# Patient Record
Sex: Female | Born: 1970 | Race: White | Hispanic: No | Marital: Married | Smoking: Current every day smoker
Health system: Southern US, Community
[De-identification: ages and names within clinical notes are randomized; demographics above are authoritative.]

## PROBLEM LIST (undated history)

## (undated) DIAGNOSIS — Z800 Family history of malignant neoplasm of digestive organs: Secondary | ICD-10-CM

## (undated) DIAGNOSIS — T8859XA Other complications of anesthesia, initial encounter: Secondary | ICD-10-CM

## (undated) DIAGNOSIS — Z803 Family history of malignant neoplasm of breast: Secondary | ICD-10-CM

## (undated) DIAGNOSIS — M1990 Unspecified osteoarthritis, unspecified site: Secondary | ICD-10-CM

## (undated) DIAGNOSIS — T7840XA Allergy, unspecified, initial encounter: Secondary | ICD-10-CM

## (undated) HISTORY — PX: DILATION AND CURETTAGE OF UTERUS: SHX78

## (undated) HISTORY — DX: Unspecified osteoarthritis, unspecified site: M19.90

## (undated) HISTORY — DX: Family history of malignant neoplasm of digestive organs: Z80.0

## (undated) HISTORY — DX: Family history of malignant neoplasm of breast: Z80.3

## (undated) HISTORY — PX: WISDOM TOOTH EXTRACTION: SHX21

## (undated) HISTORY — DX: Allergy, unspecified, initial encounter: T78.40XA

---

## 1997-11-13 ENCOUNTER — Other Ambulatory Visit: Admission: RE | Admit: 1997-11-13 | Discharge: 1997-11-13 | Payer: Self-pay | Admitting: Obstetrics and Gynecology

## 1999-02-15 ENCOUNTER — Other Ambulatory Visit: Admission: RE | Admit: 1999-02-15 | Discharge: 1999-02-15 | Payer: Self-pay | Admitting: Obstetrics and Gynecology

## 1999-02-21 ENCOUNTER — Encounter: Payer: Self-pay | Admitting: Obstetrics and Gynecology

## 1999-02-21 ENCOUNTER — Ambulatory Visit (HOSPITAL_COMMUNITY): Admission: RE | Admit: 1999-02-21 | Discharge: 1999-02-21 | Payer: Self-pay | Admitting: Obstetrics and Gynecology

## 1999-03-24 ENCOUNTER — Other Ambulatory Visit: Admission: RE | Admit: 1999-03-24 | Discharge: 1999-03-24 | Payer: Self-pay | Admitting: Obstetrics and Gynecology

## 1999-03-24 ENCOUNTER — Encounter (INDEPENDENT_AMBULATORY_CARE_PROVIDER_SITE_OTHER): Payer: Self-pay | Admitting: Specialist

## 1999-09-30 ENCOUNTER — Other Ambulatory Visit: Admission: RE | Admit: 1999-09-30 | Discharge: 1999-09-30 | Payer: Self-pay | Admitting: Obstetrics and Gynecology

## 2000-03-29 ENCOUNTER — Encounter (INDEPENDENT_AMBULATORY_CARE_PROVIDER_SITE_OTHER): Payer: Self-pay

## 2000-03-29 ENCOUNTER — Other Ambulatory Visit: Admission: RE | Admit: 2000-03-29 | Discharge: 2000-03-29 | Payer: Self-pay | Admitting: Obstetrics and Gynecology

## 2000-08-08 ENCOUNTER — Other Ambulatory Visit: Admission: RE | Admit: 2000-08-08 | Discharge: 2000-08-08 | Payer: Self-pay | Admitting: Obstetrics and Gynecology

## 2001-09-26 ENCOUNTER — Encounter: Admission: RE | Admit: 2001-09-26 | Discharge: 2001-09-26 | Payer: Self-pay | Admitting: Obstetrics and Gynecology

## 2001-09-26 ENCOUNTER — Encounter: Payer: Self-pay | Admitting: Obstetrics and Gynecology

## 2001-11-04 ENCOUNTER — Other Ambulatory Visit: Admission: RE | Admit: 2001-11-04 | Discharge: 2001-11-04 | Payer: Self-pay | Admitting: Obstetrics and Gynecology

## 2002-05-23 ENCOUNTER — Other Ambulatory Visit: Admission: RE | Admit: 2002-05-23 | Discharge: 2002-05-23 | Payer: Self-pay | Admitting: Obstetrics and Gynecology

## 2002-11-06 ENCOUNTER — Other Ambulatory Visit: Admission: RE | Admit: 2002-11-06 | Discharge: 2002-11-06 | Payer: Self-pay | Admitting: Obstetrics and Gynecology

## 2003-01-27 ENCOUNTER — Encounter: Admission: RE | Admit: 2003-01-27 | Discharge: 2003-01-27 | Payer: Self-pay | Admitting: Surgery

## 2004-02-09 ENCOUNTER — Other Ambulatory Visit: Admission: RE | Admit: 2004-02-09 | Discharge: 2004-02-09 | Payer: Self-pay | Admitting: Obstetrics and Gynecology

## 2004-02-29 ENCOUNTER — Encounter: Admission: RE | Admit: 2004-02-29 | Discharge: 2004-02-29 | Payer: Self-pay | Admitting: Obstetrics and Gynecology

## 2005-06-28 ENCOUNTER — Encounter: Admission: RE | Admit: 2005-06-28 | Discharge: 2005-06-28 | Payer: Self-pay | Admitting: Obstetrics and Gynecology

## 2005-07-20 ENCOUNTER — Encounter: Admission: RE | Admit: 2005-07-20 | Discharge: 2005-07-20 | Payer: Self-pay | Admitting: Obstetrics and Gynecology

## 2007-07-12 ENCOUNTER — Ambulatory Visit (HOSPITAL_COMMUNITY): Admission: RE | Admit: 2007-07-12 | Discharge: 2007-07-12 | Payer: Self-pay | Admitting: Obstetrics and Gynecology

## 2007-12-03 ENCOUNTER — Inpatient Hospital Stay (HOSPITAL_COMMUNITY): Admission: AD | Admit: 2007-12-03 | Discharge: 2007-12-07 | Payer: Self-pay | Admitting: Obstetrics and Gynecology

## 2010-06-24 ENCOUNTER — Encounter: Payer: Self-pay | Admitting: Gastroenterology

## 2010-08-04 ENCOUNTER — Ambulatory Visit: Payer: Self-pay | Admitting: Gastroenterology

## 2010-08-26 ENCOUNTER — Ambulatory Visit: Payer: Self-pay | Admitting: Gastroenterology

## 2010-09-20 ENCOUNTER — Encounter: Payer: Self-pay | Admitting: Gastroenterology

## 2010-09-20 ENCOUNTER — Ambulatory Visit (INDEPENDENT_AMBULATORY_CARE_PROVIDER_SITE_OTHER): Payer: BC Managed Care – PPO | Admitting: Gastroenterology

## 2010-09-20 VITALS — BP 100/62 | HR 72 | Ht 65.5 in | Wt 147.2 lb

## 2010-09-20 DIAGNOSIS — R0989 Other specified symptoms and signs involving the circulatory and respiratory systems: Secondary | ICD-10-CM

## 2010-09-20 DIAGNOSIS — F458 Other somatoform disorders: Secondary | ICD-10-CM

## 2010-09-20 DIAGNOSIS — K219 Gastro-esophageal reflux disease without esophagitis: Secondary | ICD-10-CM

## 2010-09-20 MED ORDER — DEXLANSOPRAZOLE 60 MG PO CPDR: 60.0000 mg | DELAYED_RELEASE_CAPSULE | Freq: Every day | ORAL | Status: AC

## 2010-09-20 NOTE — Patient Instructions (Signed)
Your prescription(s) have been sent to you pharmacy.  Today you have watched the [employer].     Acid Reflux (GERD) Acid reflux is also called gastroesophageal reflux disease (GERD). Your stomach makes acid to help digest food. Acid reflux happens when acid from your stomach goes into the tube between your mouth and stomach (esophagus). Your stomach is protected from the acid, but this tube is not. When acid gets into the tube, it may cause a burning feeling in the chest (heartburn). Besides heartburn, other health problems can happen if the acid keeps going into the tube. Some causes of acid reflux include:  Being overweight.   Smoking.   Drinking alcohol.   Eating large meals.   Eating meals and then going to bed right away.   Eating certain foods.   Increased stomach acid production.  HOME CARE  Take all medicine as told by your doctor.   You may need to:   Lose weight.   Avoid alcohol.   Quit smoking.   Do not eat big meals. It is better to eat smaller meals throughout the day.   Do not eat a meal and then nap or go to bed.   Sleep with your head higher than your stomach.   Avoid foods that bother you.   You may need more tests, or you may need to see a special doctor.  GET HELP RIGHT AWAY IF:  You have chest pain that is different than before.   You have pain that goes to your arms, jaw, or between your shoulder blades.   You throw up (vomit) blood, dark brown liquid, or your throw up looks like coffee grounds.   You have trouble swallowing.   You have trouble breathing or cannot stop coughing.   You feel dizzy or pass out.   Your skin is cool, wet, and pale.   Your medicine is not helping.  MAKE SURE YOU:   Understand these instructions.   Will watch your condition.   Will get help right away if you are not doing well or get worse.  Document Released: 06/21/2007 Document Re-Released: 03/29/2009 [facility] Patient Information 2011 [place],  [place].

## 2010-09-20 NOTE — Progress Notes (Signed)
History of Present Illness:  This is a nice 40 year old Caucasian female with one year of a progressive globus sensation in her throat without true reflux symptoms. Her only other complaint is one of postprandial bloatedness and on one of weight gain. She follows a regular diet but does not exercise regularly. She denies burning substernal chest pain, regurgitation, dysphagia, or any hepatobiliary lower gastrointestinal complaints. Her family history is remarkable for some type of esophageal difficulty and one of her grandmothers. Patient did not have previous radiographs or endoscopy procedures. He feels the summer for globus sensation may be related to stress, but denies anxiety or depression. She greatly had thyroid function test performed which were normal. She has no true dysphagia or systemic complaints. She is not on any medications but has an IUD present.  I have reviewed this patient's present history, medical and surgical past history, allergies and medications.     ROS: The remainder of the 10 point ROS is negative... no symptoms of Raynaud's phenomenon or collagen vascular disease. No history of hepatitis, pancreatitis, or other known gastrointestinal problems.     Physical Exam: General well developed well nourished patient in no acute distress, appearing herr stated age Eyes PERRLA, no icterus, fundoscopic exam per opthamologist Skin no lesions noted Neck supple, no adenopathy, no thyroid enlargement, no tenderness. Patient's oropharyngeal area was unremarkable. There is a small amount of tonsillar tissue in the left hypopharynx. Chest clear to percussion and auscultation Heart no significant murmurs, gallops or rubs noted Abdomen no hepatosplenomegaly masses or tenderness, BS normal.  Extremities no acute joint lesions, edema, phlebitis or evidence of cellulitis. Neurologic patient oriented x 3, cranial nerves intact, no focal neurologic deficits noted. Psychological mental status  normal and normal affect.  Assessment and plan: Probable globus sensation related to occult acid reflux. I have reviewed the extra esophageal manifestations of GERD with this patient in detail. We have elected to follow a strict anti-reflex regime, Dexilant 60 mg every morning, and office followup in 6-8 weeks' time. I have urged her to continue her dietary restraints but to begin a graduated aerobic exercise program. Will copy Dr. Stark Bray and Dr. Harold Hedge. Patient may need endoscopic exam, manometer, 24-hour pH probe testing depending on her clinical course and response  Encounter Diagnoses  Name Primary?  . Globus sensation Yes  . GERD (gastroesophageal reflux disease)

## 2010-10-19 LAB — CBC
HCT: 37.3
Hemoglobin: 12.9
MCHC: 35.3
MCV: 97
Platelets: 252
Platelets: 303
RDW: 13.1
RDW: 13.2
WBC: 10.6 — ABNORMAL HIGH

## 2010-10-19 LAB — COMPREHENSIVE METABOLIC PANEL
ALT: 16
AST: 20
Albumin: 2.3 — ABNORMAL LOW
BUN: 8
CO2: 23
Creatinine, Ser: 0.5
GFR calc Af Amer: >60
Glucose, Bld: 82
Potassium: 3.5
Sodium: 135

## 2010-10-19 LAB — URIC ACID: Uric Acid, Serum: 5.3

## 2011-12-28 ENCOUNTER — Other Ambulatory Visit: Payer: Self-pay | Admitting: Obstetrics and Gynecology

## 2011-12-28 DIAGNOSIS — R928 Other abnormal and inconclusive findings on diagnostic imaging of breast: Secondary | ICD-10-CM

## 2012-01-08 ENCOUNTER — Ambulatory Visit
Admission: RE | Admit: 2012-01-08 | Discharge: 2012-01-08 | Disposition: A | Discharge status: Home / Self Care | Payer: Private Health Insurance - Indemnity | Source: Ambulatory Visit | Attending: Obstetrics and Gynecology | Admitting: Obstetrics and Gynecology

## 2012-01-08 DIAGNOSIS — R928 Other abnormal and inconclusive findings on diagnostic imaging of breast: Secondary | ICD-10-CM

## 2012-10-25 ENCOUNTER — Ambulatory Visit (INDEPENDENT_AMBULATORY_CARE_PROVIDER_SITE_OTHER): Payer: BC Managed Care – PPO | Admitting: Internal Medicine

## 2012-10-25 ENCOUNTER — Ambulatory Visit: Payer: BC Managed Care – PPO

## 2012-10-25 VITALS — BP 120/80 | HR 63 | Temp 98.1°F | Resp 16 | Ht 67.0 in | Wt 139.0 lb

## 2012-10-25 DIAGNOSIS — R05 Cough: Secondary | ICD-10-CM

## 2012-10-25 DIAGNOSIS — R591 Generalized enlarged lymph nodes: Secondary | ICD-10-CM

## 2012-10-25 DIAGNOSIS — R5381 Other malaise: Secondary | ICD-10-CM

## 2012-10-25 DIAGNOSIS — R599 Enlarged lymph nodes, unspecified: Secondary | ICD-10-CM

## 2012-10-25 LAB — COMPREHENSIVE METABOLIC PANEL
ALT: 12 U/L (ref 0–35)
AST: 12 U/L (ref 0–37)
Albumin: 4.4 g/dL (ref 3.5–5.2)
Alkaline Phosphatase: 35 U/L — ABNORMAL LOW (ref 39–117)
BUN: 11 mg/dL (ref 6–23)
CO2: 26 mEq/L (ref 19–32)
Glucose, Bld: 87 mg/dL (ref 70–99)
Total Bilirubin: 0.4 mg/dL (ref 0.3–1.2)

## 2012-10-25 LAB — POCT CBC
Granulocyte percent: 57.9 %G (ref 37–80)
HCT, POC: 46 % (ref 37.7–47.9)
Hemoglobin: 14.8 g/dL (ref 12.2–16.2)
MCH, POC: 31.7 pg — AB (ref 27–31.2)
MCHC: 32.2 g/dL (ref 31.8–35.4)
MPV: 7.9 fL (ref 0–99.8)
POC Granulocyte: 3.2 (ref 2–6.9)
RDW, POC: 12.8 %

## 2012-10-25 NOTE — Progress Notes (Addendum)
Subjective:  This chart was scribed for Dr. Ellamae Sia, MD by Karle Plumber, ED Scribe. This patient was seen in room 3 and the patient's care was started at 11:48 AM.  Patient ID: Susan Daniel, female    DOB: 10-18-1970, 42 y.o.   MRN: [id number]  HPI HPI Comments:  Susan Daniel is a 42 y.o. female who presents to the [facility] complaining of feeling tired, aching and sore throat onset last week. She reports associated sneezing. She states she has been dealing with extreme stress. She reports associated sleep deprivation secondary to the stress. Pt reports the stress and insomnia has since relieved. She reports chronic seasonal allergies with associated chronic sinus infections that occur yearly. occas NP cough--occas R post thor pain. She reports extensive dental work that needs to be addressed but no current dental problems at the time. She denies weight loss or heart palpitations.  Pt reports having a Mirena IUD for birth control. She reports mild tenderness in the lymph nodes under her arms that have since resolved.Pt reports mild nausea earlier this week. She denies dysuria or frequency with urination. She denies waking up at night diaphoretic. She denies skin rashes. Pt reports being right handed. Pt also reports a bite on her abdomen that she has scratched the scab. She expressed concern about it not knowing where it came from or what may have caused it.    Review of Systems  Constitutional: Negative for fever, activity change, appetite change and unexpected weight change.  HENT: Positive for sore throat.   Respiratory: Negative for shortness of breath and wheezing.   Cardiovascular: Negative for palpitations and leg swelling.  Gastrointestinal: Positive for nausea and diarrhea. Negative for vomiting.       1 d diarr only-resolved  Genitourinary: Negative for dysuria and difficulty urinating.  Psychiatric/Behavioral: Negative for sleep disturbance.  All other systems reviewed  and are negative.      Objective:   Physical Exam  Nursing note and vitals reviewed. Constitutional: She is oriented to person, place, and time. She appears well-developed and well-nourished.  HENT:  Head: Normocephalic and atraumatic.  Right Ear: External ear normal.  Left Ear: External ear normal.  Neck: Neck supple. No thyromegaly present.  Tender along left anterior cervical area without masses. No axillary nodes  Cardiovascular: Normal rate and regular rhythm.   No murmur heard. Pulmonary/Chest: Effort normal and breath sounds normal. No respiratory distress.  Right lower lobe rales and rhonchi that do not clear with cough.  Abdominal: Soft. She exhibits no mass. There is no tenderness. There is no rebound and no guarding.  Musculoskeletal: She exhibits no edema.  Lymphadenopathy:    She has no cervical adenopathy.  Neurological: She is alert and oriented to person, place, and time.  Psychiatric: She has a normal mood and affect.    BP 120/80  Pulse 63  Temp(Src) 98.1 F (36.7 C) (Oral)  Resp 16  Ht 5\' 7"  (1.702 m)  Wt 139 lb (63.05 kg)  BMI 21.77 kg/m2  SpO2 99% [facility] reading (PRIMARY) by  Dr. Merla Riches no infiltrates  Results for orders placed in visit on 10/25/12  POCT CBC      Result Value Range   WBC 5.5  4.6 - 10.2 K/uL   Lymph, poc 1.9  0.6 - 3.4   POC LYMPH PERCENT 34.8  10 - 50 %L   MID (cbc) 0.4  0 - 0.9   POC MID % 7.3  0 - 12 %M  POC Granulocyte 3.2  2 - 6.9   Granulocyte percent 57.9  37 - 80 %G   RBC 4.67  4.04 - 5.48 M/uL   Hemoglobin 14.8  12.2 - 16.2 g/dL   HCT, POC 16.1  09.6 - 47.9 %   MCV 98.5 (*) 80 - 97 fL   MCH, POC 31.7 (*) 27 - 31.2 pg   MCHC 32.2  31.8 - 35.4 g/dL   RDW, POC 04.5     Platelet Count, POC 251  142 - 424 K/uL   MPV 7.9  0 - 99.8 fL       Assessment & Plan:    No orders of the defined types were placed in this encounter.    Problem List Items Addressed This Visit   None    Visit Diagnoses   Cough    -   Primary    Relevant Orders       DG Chest 2 View       POCT CBC (Completed)       TSH       Comprehensive metabolic panel    Other malaise and fatigue        Relevant Orders       POCT CBC (Completed)       TSH       Comprehensive metabolic panel    Adenopathy    Now resolved    Relevant Orders       POCT CBC (Completed)       TSH       Comprehensive metabolic panel       Pt verbalizes understanding and agrees to plan.

## 2012-10-26 LAB — TSH: TSH: 1.391 u[IU]/mL (ref 0.350–4.500)

## 2012-12-31 ENCOUNTER — Other Ambulatory Visit: Payer: Self-pay | Admitting: Obstetrics and Gynecology

## 2012-12-31 DIAGNOSIS — N6320 Unspecified lump in the left breast, unspecified quadrant: Secondary | ICD-10-CM

## 2013-01-14 ENCOUNTER — Ambulatory Visit
Admission: RE | Admit: 2013-01-14 | Discharge: 2013-01-14 | Disposition: A | Discharge status: Home / Self Care | Payer: BC Managed Care – PPO | Source: Ambulatory Visit | Attending: Obstetrics and Gynecology | Admitting: Obstetrics and Gynecology

## 2013-01-14 DIAGNOSIS — N6320 Unspecified lump in the left breast, unspecified quadrant: Secondary | ICD-10-CM

## 2013-01-21 ENCOUNTER — Ambulatory Visit (INDEPENDENT_AMBULATORY_CARE_PROVIDER_SITE_OTHER): Payer: BC Managed Care – PPO | Admitting: Family Medicine

## 2013-01-21 VITALS — BP 114/80 | HR 115 | Temp 98.2°F | Resp 18 | Ht 66.0 in | Wt 145.0 lb

## 2013-01-21 DIAGNOSIS — R05 Cough: Secondary | ICD-10-CM

## 2013-01-21 DIAGNOSIS — R059 Cough, unspecified: Secondary | ICD-10-CM

## 2013-01-21 DIAGNOSIS — J0100 Acute maxillary sinusitis, unspecified: Secondary | ICD-10-CM

## 2013-01-21 DIAGNOSIS — J029 Acute pharyngitis, unspecified: Secondary | ICD-10-CM

## 2013-01-21 MED ORDER — AMOXICILLIN 875 MG PO TABS: 875.0000 mg | ORAL_TABLET | Freq: Two times a day (BID) | ORAL | Status: DC

## 2013-01-21 NOTE — Progress Notes (Signed)
Subjective:    Patient ID: Susan Daniel, female    DOB: February 27, 1970, 43 y.o.   MRN: [id number] This chart was scribed for Susan Ray, MD by Vernell Barrier, Medical Scribe. This patient's care was started at 7:34 PM.  HPI HPI Comments: Susan Daniel is a 43 y.o. female who presents to the [facility] complaining of sore throat, cough, sinus pressure, onset a few days ago had improved some, but then worsened recently.  Reports mild green mucous production. States she was feeling bad before Christmas; had HA, body aches, and coughing but did not seek treatment. State she took Mucinex for those sxs with relief. Pt had sick contact with Bronchitis. Pt is unsure if she has come in contact with anybody with strep. Pt denies fever.   There are no active problems to display for this patient.  Past Medical History  Diagnosis Date  . Allergy   . Arthritis    History reviewed. No pertinent past surgical history. Allergies  Allergen Reactions  . Codeine   . Hydrocodone   . Sulfa Antibiotics    Prior to Admission medications   Medication Sig Start Date End Date Taking? Authorizing Provider  levonorgestrel (MIRENA) 20 MCG/24HR IUD 1 each by Intrauterine route once.      Historical Provider, MD   History   Social History  . Marital Status: Married    Spouse Name: N/A    Number of Children: 1  . Years of Education: N/A   Occupational History  . Surveyor, minerals    Social History Main Topics  . Smoking status: Current Every Day Smoker  . Smokeless tobacco: Never Used  . Alcohol Use: Yes     Comment: ocassionally  . Drug Use: No  . Sexual Activity: Not on file   Other Topics Concern  . Not on file   Social History Narrative  . No narrative on file   Review of Systems  Constitutional: Negative for fever.  HENT: Positive for rhinorrhea, sinus pressure and sore throat.   Respiratory: Positive for cough.       Objective:   Physical Exam  Vitals  reviewed. Constitutional: She is oriented to person, place, and time. She appears well-developed and well-nourished. No distress.  HENT:  Head: Normocephalic and atraumatic.  Right Ear: Hearing, tympanic membrane, external ear and ear canal normal.  Left Ear: Hearing, tympanic membrane, external ear and ear canal normal.  Nose: Nose normal.  Mouth/Throat: Oropharynx is clear and moist. No oropharyngeal exudate or posterior oropharyngeal erythema.  Maxillary sinus tenderness bilaterally. White to yellow nasal discharge right greater than left.   Eyes: Conjunctivae and EOM are normal. Pupils are equal, round, and reactive to light.  Cardiovascular: Normal rate, regular rhythm, normal heart sounds and intact distal pulses.  Exam reveals no gallop and no friction rub.   No murmur heard. Pulmonary/Chest: Effort normal and breath sounds normal. No respiratory distress. She has no wheezes. She has no rhonchi. She has no rales.  Lymphadenopathy:    She has no cervical adenopathy.  Neurological: She is alert and oriented to person, place, and time.  Skin: Skin is warm and dry. No rash noted.  Psychiatric: She has a normal mood and affect. Her behavior is normal.    Filed Vitals:   01/21/13 1835  BP: 114/80  Pulse: 115  Temp: 98.2 F (36.8 C)  TempSrc: Oral  Resp: 18  Height: 5\' 6"  (1.676 m)  Weight: 145 lb (65.772  kg)  SpO2: 99%      Assessment & Plan:   Susan Daniel is a 43 y.o. female Sinusitis, acute maxillary - Plan: amoxicillin (AMOXIL) 875 MG tablet  Sore throat - Plan: amoxicillin (AMOXIL) 875 MG tablet  Cough - Plan: amoxicillin (AMOXIL) 875 MG tablet  Suspected initial flu or ILI, with initial improvement then secondary sickening, early sinusitis.  Deferred testing for strep as afebrile and will be treating with amoxicillin for sinuses. Sx care as below and rtc precautions discussed.    Meds ordered this encounter  Medications  . amoxicillin (AMOXIL) 875 MG tablet      Sig: Take 1 tablet (875 mg total) by mouth 2 (two) times daily.    Dispense:  20 tablet    Refill:  0   Patient Instructions  Saline nasal spray atleast 4 times per day, over the counter mucinex or mucinex DM, drink plenty of fluids.  Return to clinic if worsening. Start antibiotic as discussed, Return to the clinic or go to the nearest emergency room if any of your symptoms worsen or new symptoms occur. Sinusitis Sinusitis is redness, soreness, and swelling (inflammation) of the paranasal sinuses. Paranasal sinuses are air pockets within the bones of your face (beneath the eyes, the middle of the forehead, or above the eyes). In healthy paranasal sinuses, mucus is able to drain out, and air is able to circulate through them by way of your nose. However, when your paranasal sinuses are inflamed, mucus and air can become trapped. This can allow bacteria and other germs to grow and cause infection. Sinusitis can develop quickly and last only a short time (acute) or continue over a long period (chronic). Sinusitis that lasts for more than 12 weeks is considered chronic.  CAUSES  Causes of sinusitis include:  Allergies.  Structural abnormalities, such as displacement of the cartilage that separates your nostrils (deviated septum), which can decrease the air flow through your nose and sinuses and affect sinus drainage.  Functional abnormalities, such as when the small hairs (cilia) that line your sinuses and help remove mucus do not work properly or are not present. SYMPTOMS  Symptoms of acute and chronic sinusitis are the same. The primary symptoms are pain and pressure around the affected sinuses. Other symptoms include:  Upper toothache.  Earache.  Headache.  Bad breath.  Decreased sense of smell and taste.  A cough, which worsens when you are lying flat.  Fatigue.  Fever.  Thick drainage from your nose, which often is green and may contain pus (purulent).  Swelling and  warmth over the affected sinuses. DIAGNOSIS  Your caregiver will perform a physical exam. During the exam, your caregiver may:  Look in your nose for signs of abnormal growths in your nostrils (nasal polyps).  Tap over the affected sinus to check for signs of infection.  View the inside of your sinuses (endoscopy) with a special imaging device with a light attached (endoscope), which is inserted into your sinuses. If your caregiver suspects that you have chronic sinusitis, one or more of the following tests may be recommended:  Allergy tests.  Nasal culture A sample of mucus is taken from your nose and sent to a lab and screened for bacteria.  Nasal cytology A sample of mucus is taken from your nose and examined by your caregiver to determine if your sinusitis is related to an allergy. TREATMENT  Most cases of acute sinusitis are related to a viral infection and will resolve on  their own within 10 days. Sometimes medicines are prescribed to help relieve symptoms (pain medicine, decongestants, nasal steroid sprays, or saline sprays).  However, for sinusitis related to a bacterial infection, your caregiver will prescribe antibiotic medicines. These are medicines that will help kill the bacteria causing the infection.  Rarely, sinusitis is caused by a fungal infection. In theses cases, your caregiver will prescribe antifungal medicine. For some cases of chronic sinusitis, surgery is needed. Generally, these are cases in which sinusitis recurs more than 3 times per year, despite other treatments. HOME CARE INSTRUCTIONS   Drink plenty of water. Water helps thin the mucus so your sinuses can drain more easily.  Use a humidifier.  Inhale steam 3 to 4 times a day (for example, sit in the bathroom with the shower running).  Apply a warm, moist washcloth to your face 3 to 4 times a day, or as directed by your caregiver.  Use saline nasal sprays to help moisten and clean your sinuses.  Take  over-the-counter or prescription medicines for pain, discomfort, or fever only as directed by your caregiver. SEEK IMMEDIATE MEDICAL CARE IF:  You have increasing pain or severe headaches.  You have nausea, vomiting, or drowsiness.  You have swelling around your face.  You have vision problems.  You have a stiff neck.  You have difficulty breathing. MAKE SURE YOU:   Understand these instructions.  Will watch your condition.  Will get help right away if you are not doing well or get worse. Document Released: 01/02/2005 Document Revised: 03/27/2011 Document Reviewed: 01/17/2011 [facility] Patient Information 2014 [place], [place]. Sore Throat A sore throat is pain, burning, irritation, or scratchiness of the throat. There is often pain or tenderness when swallowing or talking. A sore throat may be accompanied by other symptoms, such as coughing, sneezing, fever, and swollen neck glands. A sore throat is often the first sign of another sickness, such as a cold, flu, strep throat, or mononucleosis (commonly known as mono). Most sore throats go away without medical treatment. CAUSES  The most common causes of a sore throat include:  A viral infection, such as a cold, flu, or mono.  A bacterial infection, such as strep throat, tonsillitis, or whooping cough.  Seasonal allergies.  Dryness in the air.  Irritants, such as smoke or pollution.  Gastroesophageal reflux disease (GERD). HOME CARE INSTRUCTIONS   Only take over-the-counter medicines as directed by your caregiver.  Drink enough fluids to keep your urine clear or pale yellow.  Rest as needed.  Try using throat sprays, lozenges, or sucking on hard candy to ease any pain (if older than 4 years or as directed).  Sip warm liquids, such as broth, herbal tea, or warm water with honey to relieve pain temporarily. You may also eat or drink cold or frozen liquids such as frozen ice pops.  Gargle with salt water (mix 1 tsp salt  with 8 oz of water).  Do not smoke and avoid secondhand smoke.  Put a cool-mist humidifier in your bedroom at night to moisten the air. You can also turn on a hot shower and sit in the bathroom with the door closed for 5 10 minutes. SEEK IMMEDIATE MEDICAL CARE IF:  You have difficulty breathing.  You are unable to swallow fluids, soft foods, or your saliva.  You have increased swelling in the throat.  Your sore throat does not get better in 7 days.  You have nausea and vomiting.  You have a fever or persistent  symptoms for more than 2 3 days.  You have a fever and your symptoms suddenly get worse. MAKE SURE YOU:   Understand these instructions.  Will watch your condition.  Will get help right away if you are not doing well or get worse. Document Released: 02/10/2004 Document Revised: 12/20/2011 Document Reviewed: 09/10/2011 [facility] Patient Information 2014 [place], [place].     I personally performed the services described in this documentation, which was scribed in my presence. The recorded information has been reviewed and considered, and addended by me as needed.

## 2013-01-21 NOTE — Patient Instructions (Addendum)
Saline nasal spray atleast 4 times per day, over the counter mucinex or mucinex DM, drink plenty of fluids.  Return to clinic if worsening. Start antibiotic as discussed, Return to the clinic or go to the nearest emergency room if any of your symptoms worsen or new symptoms occur. Sinusitis Sinusitis is redness, soreness, and swelling (inflammation) of the paranasal sinuses. Paranasal sinuses are air pockets within the bones of your face (beneath the eyes, the middle of the forehead, or above the eyes). In healthy paranasal sinuses, mucus is able to drain out, and air is able to circulate through them by way of your nose. However, when your paranasal sinuses are inflamed, mucus and air can become trapped. This can allow bacteria and other germs to grow and cause infection. Sinusitis can develop quickly and last only a short time (acute) or continue over a long period (chronic). Sinusitis that lasts for more than 12 weeks is considered chronic.  CAUSES  Causes of sinusitis include:  Allergies.  Structural abnormalities, such as displacement of the cartilage that separates your nostrils (deviated septum), which can decrease the air flow through your nose and sinuses and affect sinus drainage.  Functional abnormalities, such as when the small hairs (cilia) that line your sinuses and help remove mucus do not work properly or are not present. SYMPTOMS  Symptoms of acute and chronic sinusitis are the same. The primary symptoms are pain and pressure around the affected sinuses. Other symptoms include:  Upper toothache.  Earache.  Headache.  Bad breath.  Decreased sense of smell and taste.  A cough, which worsens when you are lying flat.  Fatigue.  Fever.  Thick drainage from your nose, which often is green and may contain pus (purulent).  Swelling and warmth over the affected sinuses. DIAGNOSIS  Your caregiver will perform a physical exam. During the exam, your caregiver may:  Look in  your nose for signs of abnormal growths in your nostrils (nasal polyps).  Tap over the affected sinus to check for signs of infection.  View the inside of your sinuses (endoscopy) with a special imaging device with a light attached (endoscope), which is inserted into your sinuses. If your caregiver suspects that you have chronic sinusitis, one or more of the following tests may be recommended:  Allergy tests.  Nasal culture A sample of mucus is taken from your nose and sent to a lab and screened for bacteria.  Nasal cytology A sample of mucus is taken from your nose and examined by your caregiver to determine if your sinusitis is related to an allergy. TREATMENT  Most cases of acute sinusitis are related to a viral infection and will resolve on their own within 10 days. Sometimes medicines are prescribed to help relieve symptoms (pain medicine, decongestants, nasal steroid sprays, or saline sprays).  However, for sinusitis related to a bacterial infection, your caregiver will prescribe antibiotic medicines. These are medicines that will help kill the bacteria causing the infection.  Rarely, sinusitis is caused by a fungal infection. In theses cases, your caregiver will prescribe antifungal medicine. For some cases of chronic sinusitis, surgery is needed. Generally, these are cases in which sinusitis recurs more than 3 times per year, despite other treatments. HOME CARE INSTRUCTIONS   Drink plenty of water. Water helps thin the mucus so your sinuses can drain more easily.  Use a humidifier.  Inhale steam 3 to 4 times a day (for example, sit in the bathroom with the shower running).  Apply a  warm, moist washcloth to your face 3 to 4 times a day, or as directed by your caregiver.  Use saline nasal sprays to help moisten and clean your sinuses.  Take over-the-counter or prescription medicines for pain, discomfort, or fever only as directed by your caregiver. SEEK IMMEDIATE MEDICAL CARE  IF:  You have increasing pain or severe headaches.  You have nausea, vomiting, or drowsiness.  You have swelling around your face.  You have vision problems.  You have a stiff neck.  You have difficulty breathing. MAKE SURE YOU:   Understand these instructions.  Will watch your condition.  Will get help right away if you are not doing well or get worse. Document Released: 01/02/2005 Document Revised: 03/27/2011 Document Reviewed: 01/17/2011 [employer] Patient Information 2014 [place], [place]. Sore Throat A sore throat is pain, burning, irritation, or scratchiness of the throat. There is often pain or tenderness when swallowing or talking. A sore throat may be accompanied by other symptoms, such as coughing, sneezing, fever, and swollen neck glands. A sore throat is often the first sign of another sickness, such as a cold, flu, strep throat, or mononucleosis (commonly known as mono). Most sore throats go away without medical treatment. CAUSES  The most common causes of a sore throat include:  A viral infection, such as a cold, flu, or mono.  A bacterial infection, such as strep throat, tonsillitis, or whooping cough.  Seasonal allergies.  Dryness in the air.  Irritants, such as smoke or pollution.  Gastroesophageal reflux disease (GERD). HOME CARE INSTRUCTIONS   Only take over-the-counter medicines as directed by your caregiver.  Drink enough fluids to keep your urine clear or pale yellow.  Rest as needed.  Try using throat sprays, lozenges, or sucking on hard candy to ease any pain (if older than 4 years or as directed).  Sip warm liquids, such as broth, herbal tea, or warm water with honey to relieve pain temporarily. You may also eat or drink cold or frozen liquids such as frozen ice pops.  Gargle with salt water (mix 1 tsp salt with 8 oz of water).  Do not smoke and avoid secondhand smoke.  Put a cool-mist humidifier in your bedroom at night to moisten the air.  You can also turn on a hot shower and sit in the bathroom with the door closed for 5 10 minutes. SEEK IMMEDIATE MEDICAL CARE IF:  You have difficulty breathing.  You are unable to swallow fluids, soft foods, or your saliva.  You have increased swelling in the throat.  Your sore throat does not get better in 7 days.  You have nausea and vomiting.  You have a fever or persistent symptoms for more than 2 3 days.  You have a fever and your symptoms suddenly get worse. MAKE SURE YOU:   Understand these instructions.  Will watch your condition.  Will get help right away if you are not doing well or get worse. Document Released: 02/10/2004 Document Revised: 12/20/2011 Document Reviewed: 09/10/2011 [facility] Patient Information 2014 [place], [place].

## 2013-10-16 ENCOUNTER — Ambulatory Visit (INDEPENDENT_AMBULATORY_CARE_PROVIDER_SITE_OTHER): Payer: BC Managed Care – PPO | Admitting: Emergency Medicine

## 2013-10-16 VITALS — BP 128/78 | HR 96 | Temp 98.0°F | Resp 18 | Ht 66.75 in | Wt 153.0 lb

## 2013-10-16 DIAGNOSIS — B9789 Other viral agents as the cause of diseases classified elsewhere: Principal | ICD-10-CM

## 2013-10-16 DIAGNOSIS — J069 Acute upper respiratory infection, unspecified: Secondary | ICD-10-CM

## 2013-10-16 MED ORDER — GUAIFENESIN-DM 100-10 MG/5ML PO SYRP: 5.0000 mL | ORAL_SOLUTION | ORAL | Status: DC | PRN

## 2013-10-16 MED ORDER — PSEUDOEPHEDRINE-GUAIFENESIN ER 60-600 MG PO TB12: 1.0000 | ORAL_TABLET | Freq: Two times a day (BID) | ORAL | Status: AC

## 2013-10-16 NOTE — Patient Instructions (Signed)
Upper Respiratory Infection, Adult An upper respiratory infection (URI) is also sometimes known as the common cold. The upper respiratory tract includes the nose, sinuses, throat, trachea, and bronchi. Bronchi are the airways leading to the lungs. Most people improve within 1 week, but symptoms can last up to 2 weeks. A residual cough may last even longer.  CAUSES Many different viruses can infect the tissues lining the upper respiratory tract. The tissues become irritated and inflamed and often become very moist. Mucus production is also common. A cold is contagious. You can easily spread the virus to others by oral contact. This includes kissing, sharing a glass, coughing, or sneezing. Touching your mouth or nose and then touching a surface, which is then touched by another person, can also spread the virus. SYMPTOMS  Symptoms typically develop 1 to 3 days after you come in contact with a cold virus. Symptoms vary from person to person. They may include:  Runny nose.  Sneezing.  Nasal congestion.  Sinus irritation.  Sore throat.  Loss of voice (laryngitis).  Cough.  Fatigue.  Muscle aches.  Loss of appetite.  Headache.  Low-grade fever. DIAGNOSIS  You might diagnose your own cold based on familiar symptoms, since most people get a cold 2 to 3 times a year. Your caregiver can confirm this based on your exam. Most importantly, your caregiver can check that your symptoms are not due to another disease such as strep throat, sinusitis, pneumonia, asthma, or epiglottitis. Blood tests, throat tests, and X-rays are not necessary to diagnose a common cold, but they may sometimes be helpful in excluding other more serious diseases. Your caregiver will decide if any further tests are required. RISKS AND COMPLICATIONS  You may be at risk for a more severe case of the common cold if you smoke cigarettes, have chronic heart disease (such as heart failure) or lung disease (such as asthma), or if  you have a weakened immune system. The very young and very old are also at risk for more serious infections. Bacterial sinusitis, middle ear infections, and bacterial pneumonia can complicate the common cold. The common cold can worsen asthma and chronic obstructive pulmonary disease (COPD). Sometimes, these complications can require emergency medical care and may be life-threatening. PREVENTION  The best way to protect against getting a cold is to practice good hygiene. Avoid oral or hand contact with people with cold symptoms. Wash your hands often if contact occurs. There is no clear evidence that vitamin C, vitamin E, echinacea, or exercise reduces the chance of developing a cold. However, it is always recommended to get plenty of rest and practice good nutrition. TREATMENT  Treatment is directed at relieving symptoms. There is no cure. Antibiotics are not effective, because the infection is caused by a virus, not by bacteria. Treatment may include:  Increased fluid intake. Sports drinks offer valuable electrolytes, sugars, and fluids.  Breathing heated mist or steam (vaporizer or shower).  Eating chicken soup or other clear broths, and maintaining good nutrition.  Getting plenty of rest.  Using gargles or lozenges for comfort.  Controlling fevers with ibuprofen or acetaminophen as directed by your caregiver.  Increasing usage of your inhaler if you have asthma. Zinc gel and zinc lozenges, taken in the first 24 hours of the common cold, can shorten the duration and lessen the severity of symptoms. Pain medicines may help with fever, muscle aches, and throat pain. A variety of non-prescription medicines are available to treat congestion and runny nose. Your caregiver   can make recommendations and may suggest nasal or lung inhalers for other symptoms.  HOME CARE INSTRUCTIONS   Only take over-the-counter or prescription medicines for pain, discomfort, or fever as directed by your  caregiver.  Use a warm mist humidifier or inhale steam from a shower to increase air moisture. This may keep secretions moist and make it easier to breathe.  Drink enough water and fluids to keep your urine clear or pale yellow.  Rest as needed.  Return to work when your temperature has returned to normal or as your caregiver advises. You may need to stay home longer to avoid infecting others. You can also use a face mask and careful hand washing to prevent spread of the virus. SEEK MEDICAL CARE IF:   After the first few days, you feel you are getting worse rather than better.  You need your caregiver's advice about medicines to control symptoms.  You develop chills, worsening shortness of breath, or brown or red sputum. These may be signs of pneumonia.  You develop yellow or brown nasal discharge or pain in the face, especially when you bend forward. These may be signs of sinusitis.  You develop a fever, swollen neck glands, pain with swallowing, or white areas in the back of your throat. These may be signs of strep throat. SEEK IMMEDIATE MEDICAL CARE IF:   You have a fever.  You develop severe or persistent headache, ear pain, sinus pain, or chest pain.  You develop wheezing, a prolonged cough, cough up blood, or have a change in your usual mucus (if you have chronic lung disease).  You develop sore muscles or a stiff neck. Document Released: 06/28/2000 Document Revised: 03/27/2011 Document Reviewed: 04/09/2013 [employer] Patient Information 2015 [employer]. This information is not intended to replace advice given to you by your health care provider. Make sure you discuss any questions you have with your health care provider.  

## 2013-10-16 NOTE — Progress Notes (Signed)
[facility] [address], [place] [address] [phone]  Date:  10/16/2013   Name:  Susan Daniel   DOB:  Jun 08, 1970   MRN:  [id number]  PCP:  Leandrew Koyanagi, MD    Chief Complaint: Headache and Neck Pain   History of Present Illness:  Susan Daniel is a 43 y.o. very pleasant female patient who presents with the following:  Had a "cold" in august and another when "cold" a few weeks ago.  Now has nasal congestion and post nasal drainage. Hoarse and sore throat. No dysphagia Cough that is not productive.  No wheezing or shortness of breath Has headache, malaise and myalgias. Pain in back of neck after coughing.   No fever or chills. No improvement with over the counter medications or other home remedies. Denies other complaint or health concern today.   There are no active problems to display for this patient.   Past Medical History  Diagnosis Date  . Allergy   . Arthritis     History reviewed. No pertinent past surgical history.  History  Substance Use Topics  . Smoking status: Current Every Day Smoker  . Smokeless tobacco: Never Used  . Alcohol Use: Yes     Comment: ocassionally    Family History  Problem Relation Age of Onset  . Heart disease Paternal Grandfather   . Aneurysm Paternal Grandfather   . Irritable bowel syndrome      Cousins  . Kidney disease Maternal Uncle   . Diabetes Maternal Grandmother   . Heart disease Maternal Grandmother   . Breast cancer Mother   . Kidney disease Maternal Grandfather     Allergies  Allergen Reactions  . Codeine   . Hydrocodone   . Sulfa Antibiotics     Medication list has been reviewed and updated.  Current Outpatient Prescriptions on File Prior to Visit  Medication Sig Dispense Refill  . levonorgestrel (MIRENA) 20 MCG/24HR IUD 1 each by Intrauterine route once.         No current facility-administered medications on file prior to visit.    Review of Systems:  As per  HPI, otherwise negative.    Physical Examination: Filed Vitals:   10/16/13 1524  BP: 128/78  Pulse: 96  Temp: 98 F (36.7 C)  Resp: 18   Filed Vitals:   10/16/13 1524  Height: 5' 6.75" (1.695 m)  Weight: 153 lb (69.4 kg)   Body mass index is 24.16 kg/(m^2). Ideal Body Weight: Weight in (lb) to have BMI = 25: 158.1  GEN: WDWN, NAD, Non-toxic, A & O x 3 HEENT: Atraumatic, Normocephalic. Neck supple. No masses, No LAD. Ears and Nose: No external deformity. CV: RRR, No M/G/R. No JVD. No thrill. No extra heart sounds. PULM: CTA B, no wheezes, crackles, rhonchi. No retractions. No resp. distress. No accessory muscle use. ABD: S, NT, ND, +BS. No rebound. No HSM. EXTR: No c/c/e NEURO Normal gait.  PSYCH: Normally interactive. Conversant. Not depressed or anxious appearing.  Calm demeanor.    Assessment and Plan: Viral URI Phen c cod mucinex d  Signed,  Ellison Carwin, MD

## 2014-01-02 ENCOUNTER — Other Ambulatory Visit: Payer: Self-pay | Admitting: Obstetrics and Gynecology

## 2014-01-02 DIAGNOSIS — Z803 Family history of malignant neoplasm of breast: Secondary | ICD-10-CM

## 2014-01-02 DIAGNOSIS — R922 Inconclusive mammogram: Secondary | ICD-10-CM

## 2014-01-16 ENCOUNTER — Ambulatory Visit (INDEPENDENT_AMBULATORY_CARE_PROVIDER_SITE_OTHER): Payer: BC Managed Care – PPO | Admitting: Emergency Medicine

## 2014-01-16 VITALS — BP 126/80 | HR 75 | Temp 98.1°F | Resp 18 | Ht 66.0 in | Wt 152.8 lb

## 2014-01-16 DIAGNOSIS — J020 Streptococcal pharyngitis: Secondary | ICD-10-CM

## 2014-01-16 MED ORDER — PENICILLIN V POTASSIUM 500 MG PO TABS: 500.0000 mg | ORAL_TABLET | Freq: Four times a day (QID) | ORAL | Status: DC

## 2014-01-16 NOTE — Patient Instructions (Signed)

## 2014-01-16 NOTE — Progress Notes (Signed)
[facility] [address] [place] [address] [phone]  Date:  01/16/2014   Name:  Susan Daniel   DOB:  Jul 27, 1970   MRN:  [id number]  PCP:  Leandrew Koyanagi, MD    Chief Complaint: Sore Throat   History of Present Illness:  Susan Daniel is a 44 y.o. very pleasant female patient who presents with the following:  Ill with a sore throat for past week No fever or chills No cough or coryza No nausea or vomiting No stool change Husband has similar symptoms No improvement with over the counter medications or other home remedies.  Denies other complaint or health concern today.   There are no active problems to display for this patient.   Past Medical History  Diagnosis Date  . Allergy   . Arthritis     History reviewed. No pertinent past surgical history.  History  Substance Use Topics  . Smoking status: Current Every Day Smoker  . Smokeless tobacco: Never Used  . Alcohol Use: Yes     Comment: ocassionally    Family History  Problem Relation Age of Onset  . Heart disease Paternal Grandfather   . Aneurysm Paternal Grandfather   . Irritable bowel syndrome      Cousins  . Kidney disease Maternal Uncle   . Diabetes Maternal Grandmother   . Heart disease Maternal Grandmother   . Breast cancer Mother   . Kidney disease Maternal Grandfather     Allergies  Allergen Reactions  . Codeine   . Hydrocodone   . Sulfa Antibiotics     Medication list has been reviewed and updated.  Current Outpatient Prescriptions on File Prior to Visit  Medication Sig Dispense Refill  . guaiFENesin-dextromethorphan (ROBITUSSIN DM) 100-10 MG/5ML syrup Take 5 mLs by mouth every 4 (four) hours as needed for cough. 118 mL 0  . levonorgestrel (MIRENA) 20 MCG/24HR IUD 1 each by Intrauterine route once.      . pseudoephedrine-guaifenesin (MUCINEX D) 60-600 MG per tablet Take 1 tablet by mouth every 12 (twelve) hours. 18 tablet 0   No current  facility-administered medications on file prior to visit.    Review of Systems:  As per HPI, otherwise negative.    Physical Examination: Filed Vitals:   01/16/14 1504  BP: 126/80  Pulse: 75  Temp: 98.1 F (36.7 C)  Resp: 18   Filed Vitals:   01/16/14 1504  Height: 5\' 6"  (1.676 m)  Weight: 152 lb 12.8 oz (69.31 kg)   Body mass index is 24.67 kg/(m^2). Ideal Body Weight: Weight in (lb) to have BMI = 25: 154.6  GEN: WDWN, NAD, Non-toxic, A & O x 3 HEENT: Atraumatic, Normocephalic. Neck supple. No masses, No LAD.  Erythematous oropharynx Ears and Nose: No external deformity. CV: RRR, No M/G/R. No JVD. No thrill. No extra heart sounds. PULM: CTA B, no wheezes, crackles, rhonchi. No retractions. No resp. distress. No accessory muscle use. ABD: S, NT, ND, +BS. No rebound. No HSM. EXTR: No c/c/e NEURO Normal gait.  PSYCH: Normally interactive. Conversant. Not depressed or anxious appearing.  Calm demeanor.    Assessment and Plan: Strep Pen vk  Signed,  Ellison Carwin, MD

## 2014-01-24 ENCOUNTER — Telehealth: Payer: Self-pay

## 2014-01-24 NOTE — Telephone Encounter (Signed)
Pt called stating she was here 01/16/14 and was treated for a sore throat and given penicillin v potassium (VEETID) 500 MG tablet [[id number]. Her throat is still very sore and she wants to know what to do. Please advise at [phone]

## 2014-01-25 NOTE — Telephone Encounter (Signed)
LMOM to RTC if not well.

## 2014-12-14 ENCOUNTER — Encounter: Payer: Self-pay | Admitting: Internal Medicine

## 2015-01-05 ENCOUNTER — Other Ambulatory Visit: Payer: Self-pay | Admitting: Obstetrics and Gynecology

## 2015-01-05 DIAGNOSIS — N6310 Unspecified lump in the right breast, unspecified quadrant: Secondary | ICD-10-CM

## 2015-01-08 ENCOUNTER — Other Ambulatory Visit: Payer: Self-pay

## 2015-01-13 ENCOUNTER — Ambulatory Visit
Admission: RE | Admit: 2015-01-13 | Discharge: 2015-01-13 | Disposition: A | Discharge status: Home / Self Care | Payer: 59 | Source: Ambulatory Visit | Attending: Obstetrics and Gynecology | Admitting: Obstetrics and Gynecology

## 2015-01-13 DIAGNOSIS — N6310 Unspecified lump in the right breast, unspecified quadrant: Secondary | ICD-10-CM

## 2016-11-28 ENCOUNTER — Encounter: Payer: Self-pay | Admitting: Emergency Medicine

## 2016-11-28 ENCOUNTER — Other Ambulatory Visit: Payer: Self-pay

## 2016-11-28 ENCOUNTER — Ambulatory Visit: Payer: 59 | Admitting: Emergency Medicine

## 2016-11-28 VITALS — BP 108/76 | HR 89 | Temp 98.8°F | Resp 16 | Ht 65.0 in | Wt 138.4 lb

## 2016-11-28 DIAGNOSIS — J0191 Acute recurrent sinusitis, unspecified: Secondary | ICD-10-CM

## 2016-11-28 DIAGNOSIS — J3489 Other specified disorders of nose and nasal sinuses: Secondary | ICD-10-CM

## 2016-11-28 DIAGNOSIS — R0981 Nasal congestion: Secondary | ICD-10-CM

## 2016-11-28 MED ORDER — TRIAMCINOLONE ACETONIDE 55 MCG/ACT NA AERO: 2.0000 | INHALATION_SPRAY | Freq: Every day | NASAL | 12 refills | Status: DC

## 2016-11-28 MED ORDER — AMOXICILLIN-POT CLAVULANATE 875-125 MG PO TABS: 1.0000 | ORAL_TABLET | Freq: Two times a day (BID) | ORAL | 0 refills | Status: AC

## 2016-11-28 MED ORDER — PREDNISONE 20 MG PO TABS: 40.0000 mg | ORAL_TABLET | Freq: Every day | ORAL | 0 refills | Status: AC

## 2016-11-28 NOTE — Patient Instructions (Addendum)
     IF you received an x-ray today, you will receive an invoice from [facility]. Please contact [facility] at [phone] with questions or concerns regarding your invoice.   IF you received labwork today, you will receive an invoice from [facility]. Please contact [facility] at [phone] with questions or concerns regarding your invoice.   Our billing staff will not be able to assist you with questions regarding bills from these companies.  You will be contacted with the lab results as soon as they are available. The fastest way to get your results is to activate your My Chart account. Instructions are located on the last page of this paperwork. If you have not heard from us regarding the results in 2 weeks, please contact this office.     Sinusitis, Adult Sinusitis is soreness and inflammation of your sinuses. Sinuses are hollow spaces in the bones around your face. They are located:  Around your eyes.  In the middle of your forehead.  Behind your nose.  In your cheekbones.  Your sinuses and nasal passages are lined with a stringy fluid (mucus). Mucus normally drains out of your sinuses. When your nasal tissues get inflamed or swollen, the mucus can get trapped or blocked so air cannot flow through your sinuses. This lets bacteria, viruses, and funguses grow, and that leads to infection. Follow these instructions at home: Medicines  Take, use, or apply over-the-counter and prescription medicines only as told by your doctor. These may include nasal sprays.  If you were prescribed an antibiotic medicine, take it as told by your doctor. Do not stop taking the antibiotic even if you start to feel better. Hydrate and Humidify  Drink enough water to keep your pee (urine) clear or pale yellow.  Use a cool mist humidifier to keep the humidity level in your home above 50%.  Breathe in steam for 10-15 minutes, 3-4 times a day or as told by your doctor. You can do  this in the bathroom while a hot shower is running.  Try not to spend time in cool or dry air. Rest  Rest as much as possible.  Sleep with your head raised (elevated).  Make sure to get enough sleep each night. General instructions  Put a warm, moist washcloth on your face 3-4 times a day or as told by your doctor. This will help with discomfort.  Wash your hands often with soap and water. If there is no soap and water, use hand sanitizer.  Do not smoke. Avoid being around people who are smoking (secondhand smoke).  Keep all follow-up visits as told by your doctor. This is important. Contact a doctor if:  You have a fever.  Your symptoms get worse.  Your symptoms do not get better within 10 days. Get help right away if:  You have a very bad headache.  You cannot stop throwing up (vomiting).  You have pain or swelling around your face or eyes.  You have trouble seeing.  You feel confused.  Your neck is stiff.  You have trouble breathing. This information is not intended to replace advice given to you by your health care provider. Make sure you discuss any questions you have with your health care provider. Document Released: 06/21/2007 Document Revised: 08/29/2015 Document Reviewed: 10/28/2014 [employer] Interactive Patient Education  2018 [employer]  

## 2016-11-28 NOTE — Progress Notes (Signed)
Susan Daniel 46 y.o.   Chief Complaint  Patient presents with  . Sinus Problem    with pressure X 2 weeks it started  . Ear Pain    LEFT    HISTORY OF PRESENT ILLNESS: This is a 46 y.o. female complaining of sinus pressure and congestion x 2 weeks.  Sinus Problem  This is a recurrent problem. The current episode started 1 to 4 weeks ago. The problem has been waxing and waning since onset. There has been no fever. The pain is moderate. Associated symptoms include congestion, coughing, headaches, sinus pressure and swollen glands. Pertinent negatives include no chills, neck pain, shortness of breath or sore throat.     Prior to Admission medications   Medication Sig Start Date End Date Taking? Authorizing Provider  levonorgestrel (MIRENA) 20 MCG/24HR IUD 1 each by Intrauterine route once.     Yes [provider]  OVER THE COUNTER MEDICATION daily as needed.   Yes [provider]  OVER THE COUNTER MEDICATION daily.   Yes [provider]  guaiFENesin-dextromethorphan (ROBITUSSIN DM) 100-10 MG/5ML syrup Take 5 mLs by mouth every 4 (four) hours as needed for cough. Patient not taking: Reported on 11/28/2016 10/16/13   Roselee Culver, MD  penicillin v potassium (VEETID) 500 MG tablet Take 1 tablet (500 mg total) by mouth 4 (four) times daily. Patient not taking: Reported on 11/28/2016 01/16/14   Roselee Culver, MD    Allergies  Allergen Reactions  . Codeine   . Hydrocodone   . Sulfa Antibiotics     There are no active problems to display for this patient.   Past Medical History:  Diagnosis Date  . Allergy   . Arthritis     No past surgical history on file.  Social History   Socioeconomic History  . Marital status: Married    Spouse name: Not on file  . Number of children: 1  . Years of education: Not on file  . Highest education level: Not on file  Social Needs  . Financial resource strain: Not on file  . Food insecurity -  worry: Not on file  . Food insecurity - inability: Not on file  . Transportation needs - medical: Not on file  . Transportation needs - non-medical: Not on file  Occupational History  . Occupation: Control and instrumentation engineer: [employer]  Tobacco Use  . Smoking status: Current Every Day Smoker  . Smokeless tobacco: Never Used  Substance and Sexual Activity  . Alcohol use: Yes    Comment: ocassionally  . Drug use: No  . Sexual activity: Not on file  Other Topics Concern  . Not on file  Social History Narrative  . Not on file    Family History  Problem Relation Age of Onset  . Heart disease Paternal Grandfather   . Aneurysm Paternal Grandfather   . Irritable bowel syndrome Unknown        Cousins  . Kidney disease Maternal Uncle   . Diabetes Maternal Grandmother   . Heart disease Maternal Grandmother   . Breast cancer Mother   . Kidney disease Maternal Grandfather      Review of Systems  Constitutional: Negative.  Negative for chills.  HENT: Positive for congestion and sinus pressure. Negative for sore throat.   Eyes: Negative.   Respiratory: Positive for cough. Negative for shortness of breath.   Cardiovascular: Negative for chest pain and palpitations.  Gastrointestinal: Negative.  Negative for  nausea and vomiting.  Musculoskeletal: Negative for neck pain.  Skin: Negative.  Negative for rash.  Neurological: Positive for headaches.  Endo/Heme/Allergies: Negative.   All other systems reviewed and are negative.  Vitals:   11/28/16 1734  BP: 108/76  Pulse: 89  Resp: 16  Temp: 98.8 F (37.1 C)  SpO2: 99%     Physical Exam  Constitutional: She is oriented to person, place, and time. She appears well-developed and well-nourished.  HENT:  Head: Normocephalic and atraumatic.  Nose: Mucosal edema present. Right sinus exhibits maxillary sinus tenderness. Left sinus exhibits maxillary sinus tenderness.  Mouth/Throat: Oropharynx is clear and moist.    Eyes: Conjunctivae and EOM are normal. Pupils are equal, round, and reactive to light.  Neck: Normal range of motion. Neck supple. No JVD present.  Cardiovascular: Normal rate, regular rhythm, normal heart sounds and intact distal pulses.  Pulmonary/Chest: Effort normal and breath sounds normal.  Abdominal: Soft. Bowel sounds are normal. She exhibits no distension.  Musculoskeletal: Normal range of motion.  Lymphadenopathy:    She has no cervical adenopathy.  Neurological: She is alert and oriented to person, place, and time. No sensory deficit. She exhibits normal muscle tone.  Skin: Skin is warm and dry. Capillary refill takes less than 2 seconds.  Psychiatric: She has a normal mood and affect. Her behavior is normal.  Vitals reviewed.    ASSESSMENT & PLAN:  Susan Daniel was seen today for sinus problem and ear pain.  Diagnoses and all orders for this visit:  Sinus congestion -     Ambulatory referral to ENT  Sinus pressure -     Ambulatory referral to ENT  Acute recurrent sinusitis, unspecified location  Other orders -     amoxicillin-clavulanate (AUGMENTIN) 875-125 MG tablet; Take 1 tablet 2 (two) times daily for 7 days by mouth. -     predniSONE (DELTASONE) 20 MG tablet; Take 2 tablets (40 mg total) daily with breakfast for 5 days by mouth. -     triamcinolone (NASACORT) 55 MCG/ACT AERO nasal inhaler; Place 2 sprays daily into the nose.    Patient Instructions       IF you received an x-ray today, you will receive an invoice from [facility]. Please contact [facility] at [phone] with questions or concerns regarding your invoice.   IF you received labwork today, you will receive an invoice from [place]. Please contact [facility] at [phone] with questions or concerns regarding your invoice.   Our billing staff will not be able to assist you with questions regarding bills from these companies.  You will be contacted with the lab results as  soon as they are available. The fastest way to get your results is to activate your My Chart account. Instructions are located on the last page of this paperwork. If you have not heard from [place] regarding the results in 2 weeks, please contact this office.     Sinusitis, Adult Sinusitis is soreness and inflammation of your sinuses. Sinuses are hollow spaces in the bones around your face. They are located:  Around your eyes.  In the middle of your forehead.  Behind your nose.  In your cheekbones.  Your sinuses and nasal passages are lined with a stringy fluid (mucus). Mucus normally drains out of your sinuses. When your nasal tissues get inflamed or swollen, the mucus can get trapped or blocked so air cannot flow through your sinuses. This lets bacteria, viruses, and funguses grow, and that leads to infection. Follow these  instructions at home: Medicines  Take, use, or apply over-the-counter and prescription medicines only as told by your doctor. These may include nasal sprays.  If you were prescribed an antibiotic medicine, take it as told by your doctor. Do not stop taking the antibiotic even if you start to feel better. Hydrate and Humidify  Drink enough water to keep your pee (urine) clear or pale yellow.  Use a cool mist humidifier to keep the humidity level in your home above 50%.  Breathe in steam for 10-15 minutes, 3-4 times a day or as told by your doctor. You can do this in the bathroom while a hot shower is running.  Try not to spend time in cool or dry air. Rest  Rest as much as possible.  Sleep with your head raised (elevated).  Make sure to get enough sleep each night. General instructions  Put a warm, moist washcloth on your face 3-4 times a day or as told by your doctor. This will help with discomfort.  Wash your hands often with soap and water. If there is no soap and water, use hand sanitizer.  Do not smoke. Avoid being around people who are smoking  (secondhand smoke).  Keep all follow-up visits as told by your doctor. This is important. Contact a doctor if:  You have a fever.  Your symptoms get worse.  Your symptoms do not get better within 10 days. Get help right away if:  You have a very bad headache.  You cannot stop throwing up (vomiting).  You have pain or swelling around your face or eyes.  You have trouble seeing.  You feel confused.  Your neck is stiff.  You have trouble breathing. This information is not intended to replace advice given to you by your health care provider. Make sure you discuss any questions you have with your health care provider. Document Released: 06/21/2007 Document Revised: 08/29/2015 Document Reviewed: 10/28/2014 Elsevier Interactive Patient Education  2018 [employer] Inc.     Agustina Caroli, MD [facility]

## 2017-07-23 ENCOUNTER — Ambulatory Visit (INDEPENDENT_AMBULATORY_CARE_PROVIDER_SITE_OTHER): Payer: 59

## 2017-07-23 ENCOUNTER — Encounter: Payer: Self-pay | Admitting: Family Medicine

## 2017-07-23 ENCOUNTER — Other Ambulatory Visit: Payer: Self-pay

## 2017-07-23 ENCOUNTER — Ambulatory Visit: Payer: 59 | Admitting: Family Medicine

## 2017-07-23 VITALS — BP 127/77 | HR 89 | Temp 98.3°F | Ht 65.0 in | Wt 133.6 lb

## 2017-07-23 DIAGNOSIS — S40012A Contusion of left shoulder, initial encounter: Secondary | ICD-10-CM | POA: Diagnosis not present

## 2017-07-23 DIAGNOSIS — M62830 Muscle spasm of back: Secondary | ICD-10-CM | POA: Diagnosis not present

## 2017-07-23 DIAGNOSIS — M549 Dorsalgia, unspecified: Secondary | ICD-10-CM

## 2017-07-23 NOTE — Patient Instructions (Addendum)
I suspect you had a contusion or bruising of the shoulder blade with secondary spasm of the muscles in your back.  Based on intermittent spasm and pain in that area still, I think you would benefit from physical therapy. I placed that referral.  Follow-up in 3 to 4 weeks to see how that is going.    At any point if you would like to meet with orthopedics I am happy to refer you.  Just let me know.    Occasional ibuprofen over-the-counter if needed for discomfort is fine for now, as well as ice or heat to that area for 15 minutes of the time if it feels better.  Return to the clinic or go to the nearest emergency room if any of your symptoms worsen or new symptoms occur.   Back Pain, Adult Many adults have back pain from time to time. Common causes of back pain include:  A strained muscle or ligament.  Wear and tear (degeneration) of the spinal disks.  Arthritis.  A hit to the back.  Back pain can be short-lived (acute) or last a long time (chronic). A physical exam, lab tests, and imaging studies may be done to find the cause of your pain. Follow these instructions at home: Managing pain and stiffness  Take over-the-counter and prescription medicines only as told by your health care provider.  If directed, apply heat to the affected area as often as told by your health care provider. Use the heat source that your health care provider recommends, such as a moist heat pack or a heating pad. ? Place a towel between your skin and the heat source. ? Leave the heat on for 20-30 minutes. ? Remove the heat if your skin turns bright red. This is especially important if you are unable to feel pain, heat, or cold. You have a greater risk of getting burned.  If directed, apply ice to the injured area: ? Put ice in a plastic bag. ? Place a towel between your skin and the bag. ? Leave the ice on for 20 minutes, 2-3 times a day for the first 2-3 days. Activity  Do not stay in bed. Resting more  than 1-2 days can delay your recovery.  Take short walks on even surfaces as soon as you are able. Try to increase the length of time you walk each day.  Do not sit, drive, or stand in one place for more than 30 minutes at a time. Sitting or standing for long periods of time can put stress on your back.  Use proper lifting techniques. When you bend and lift, use positions that put less stress on your back: ? [place] your knees. ? Keep the load close to your body. ? Avoid twisting.  Exercise regularly as told by your health care provider. Exercising will help your back heal faster. This also helps prevent back injuries by keeping muscles strong and flexible.  Your health care provider may recommend that you see a physical therapist. This person can help you come up with a safe exercise program. Do any exercises as told by your physical therapist. Lifestyle  Maintain a healthy weight. Extra weight puts stress on your back and makes it difficult to have good posture.  Avoid activities or situations that make you feel anxious or stressed. Learn ways to manage anxiety and stress. One way to manage stress is through exercise. Stress and anxiety increase muscle tension and can make back pain worse. General instructions  Sleep  on a firm mattress in a comfortable position. Try lying on your side with your knees slightly bent. If you lie on your back, put a pillow under your knees.  Follow your treatment plan as told by your health care provider. This may include: ? Cognitive or behavioral therapy. ? Acupuncture or massage therapy. ? Meditation or yoga. Contact a health care provider if:  You have pain that is not relieved with rest or medicine.  You have increasing pain going down into your legs or buttocks.  Your pain does not improve in 2 weeks.  You have pain at night.  You lose weight.  You have a fever or chills. Get help right away if:  You develop new bowel or bladder control  problems.  You have unusual weakness or numbness in your arms or legs.  You develop nausea or vomiting.  You develop abdominal pain.  You feel faint. Summary  Many adults have back pain from time to time. A physical exam, lab tests, and imaging studies may be done to find the cause of your pain.  Use proper lifting techniques. When you bend and lift, use positions that put less stress on your back.  Take over-the-counter and prescription medicines and apply heat or ice as directed by your health care provider. This information is not intended to replace advice given to you by your health care provider. Make sure you discuss any questions you have with your health care provider. Document Released: 01/02/2005 Document Revised: 02/07/2016 Document Reviewed: 02/07/2016 Elsevier Interactive Patient Education  2018 [employer].  Muscle Cramps and Spasms Muscle cramps and spasms occur when a muscle or muscles tighten and you have no control over this tightening (involuntary muscle contraction). They are a common problem and can develop in any muscle. The most common place is in the calf muscles of the leg. Muscle cramps and muscle spasms are both involuntary muscle contractions, but there are some differences between the two:  Muscle cramps are painful. They come and go and may last a few seconds to 15 minutes. Muscle cramps are often more forceful and last longer than muscle spasms.  Muscle spasms may or may not be painful. They may also last just a few seconds or much longer.  Certain medical conditions, such as diabetes or Parkinson disease, can make it more likely to develop cramps or spasms. However, cramps or spasms are usually not caused by a serious underlying problem. Common causes include:  Overexertion.  Overuse from repetitive motions, or doing the same thing over and over.  Remaining in a certain position for a long period of time.  Improper preparation, form, or technique  while playing a sport or doing an activity.  Dehydration.  Injury.  Side effects of some medicines.  Abnormally low levels of the salts and ions in your blood (electrolytes), especially potassium and calcium. This could happen if you are taking water pills (diuretics) or if you are pregnant.  In many cases, the cause of muscle cramps or spasms is unknown. Follow these instructions at home:  Stay well hydrated. Drink enough fluid to keep your urine clear or pale yellow.  Try massaging, stretching, and relaxing the affected muscle.  If directed, apply heat to tight or tense muscles as often as told by your health care provider. Use the heat source that your health care provider recommends, such as a moist heat pack or a heating pad. ? Place a towel between your skin and the heat source. ?  Leave the heat on for 20-30 minutes. ? Remove the heat if your skin turns bright red. This is especially important if you are unable to feel pain, heat, or cold. You may have a greater risk of getting burned.  If directed, put ice on the affected area. This may help if you are sore or have pain after a cramp or spasm. ? Put ice in a plastic bag. ? Place a towel between your skin and the bag. ? Leavethe ice on for 20 minutes, 2-3 times a day.  Take over-the-counter and prescription medicines only as told by your health care provider.  Pay attention to any changes in your symptoms. Contact a health care provider if:  Your cramps or spasms get more severe or happen more often.  Your cramps or spasms do not improve over time. This information is not intended to replace advice given to you by your health care provider. Make sure you discuss any questions you have with your health care provider. Document Released: 06/24/2001 Document Revised: 02/03/2015 Document Reviewed: 10/06/2014 Elsevier Interactive Patient Education  2018 [employer].    IF you received an x-ray today, you will receive an  invoice from [facility]. Please contact [facility] at [phone] with questions or concerns regarding your invoice.   IF you received labwork today, you will receive an invoice from [place]. Please contact [facility] at [phone] with questions or concerns regarding your invoice.   Our billing staff will not be able to assist you with questions regarding bills from these companies.  You will be contacted with the lab results as soon as they are available. The fastest way to get your results is to activate your My Chart account. Instructions are located on the last page of this paperwork. If you have not heard from [place] regarding the results in 2 weeks, please contact this office.

## 2017-07-23 NOTE — Progress Notes (Signed)
Subjective:  By signing my name below, I, Moises Blood, attest that this documentation has been prepared under the direction and in the presence of [name], MD. Electronically Signed: Moises Blood, [place]. 07/23/2017 , 3:22 PM .  Patient was seen in Room 12 .   Patient ID: Susan Daniel, female    DOB: 23-Feb-1970, 47 y.o.   MRN: [id number] Chief Complaint  Patient presents with   Back Pain    bruse or mark on the back aswell. since she fell    HPI Susan Daniel is a 47 y.o. female Here for back pain. Patient states she fell into an opened cabinet door about Oct-Nov 2018. Her daughter was standing on a barstool, said she's going to jump down, so she went to catch her (weighing 45-50 lbs). Upon catching her daughter, she fell backwards onto the cabinet door, caught over her left shoulder. She noticed a mark over the area. Since then, if she overexerts herself, she feels a lot of pain flare in that area when she was moving trees that fell in the winter. She also had a flare after vomiting with stomach bug. She also pushed a dryer into her house about 2-3 weeks ago, causing it to flare up again. She's been using heat pad and ice pack over the area. She denies any falls or MVC's recently, but does occasionally bump into that area. Her initial soreness lasted for about a month. She's taken Advil/ibuprofen, ice pack and heat pad over the area. This is the first visit for this issue.   She has an appointment with dermatologist coming up regarding the discolored area over her left shoulder.   There are no active problems to display for this patient.  Past Medical History:  Diagnosis Date   Allergy    Arthritis    No past surgical history on file. Allergies  Allergen Reactions   Codeine    Hydrocodone    Sulfa Antibiotics    Prior to Admission medications   Medication Sig Start Date End Date Taking? Authorizing Provider  guaiFENesin-dextromethorphan (ROBITUSSIN DM)  100-10 MG/5ML syrup Take 5 mLs by mouth every 4 (four) hours as needed for cough. Patient not taking: Reported on 11/28/2016 10/16/13   Roselee Culver, MD  levonorgestrel [facility]) 20 MCG/24HR IUD 1 each by Intrauterine route once.      [provider]  OVER THE COUNTER MEDICATION daily as needed.    [provider]  OVER THE COUNTER MEDICATION daily.    [provider]  penicillin v potassium (VEETID) 500 MG tablet Take 1 tablet (500 mg total) by mouth 4 (four) times daily. Patient not taking: Reported on 11/28/2016 01/16/14   Roselee Culver, MD  triamcinolone (NASACORT) 55 MCG/ACT AERO nasal inhaler Place 2 sprays daily into the nose. 11/28/16   Horald Pollen, MD   Social History   Socioeconomic History   Marital status: Married    Spouse name: Not on file   Number of children: 1   Years of education: Not on file   Highest education level: Not on file  Occupational History   Occupation: Control and instrumentation engineer: [employer]: Not on file   Food insecurity:    Worry: Not on file    Inability: Not on file   Transportation needs:    Medical: Not on file    Non-medical: Not on file  Tobacco Use  Smoking status: Current Every Day Smoker   Smokeless tobacco: Never Used  Substance and Sexual Activity   Alcohol use: Yes    Comment: ocassionally   Drug use: No   Sexual activity: Not on file  Lifestyle   Physical activity:    Days per week: Not on file    Minutes per session: Not on file   Stress: Not on file  Relationships   Social connections:    Talks on phone: Not on file    Gets together: Not on file    Attends religious service: Not on file    Active member of club or organization: Not on file    Attends meetings of clubs or organizations: Not on file    Relationship status: Not on file   Intimate partner violence:    Fear of current or ex partner: Not  on file    Emotionally abused: Not on file    Physically abused: Not on file    Forced sexual activity: Not on file  Other Topics Concern   Not on file  Social History Narrative   Not on file   Review of Systems  Constitutional: Negative for chills, fatigue, fever and unexpected weight change.  Respiratory: Negative for cough.   Gastrointestinal: Negative for constipation, diarrhea, nausea and vomiting.  Musculoskeletal: Positive for back pain.  Skin: Negative for rash and wound.  Neurological: Negative for dizziness, weakness and headaches.       Objective:   Physical Exam  Constitutional: She is oriented to person, place, and time. She appears well-developed and well-nourished. No distress.  HENT:  Head: Normocephalic and atraumatic.  Eyes: Pupils are equal, round, and reactive to light. EOM are normal.  Neck: Neck supple.  Cardiovascular: Normal rate.  Pulmonary/Chest: Effort normal. No respiratory distress.  Musculoskeletal: Normal range of motion.  Hyperpigmentation over the left upper back at the base of her left scapula measuring approximately 2.5 cm x 2 cm, no surrounding erythema, no induration; some tenderness over left scapula but diffuse, slight pulling sensation with lateral cervical spine flexion otherwise pain free ROM; [place]/AC joints non tender, full ROM of bilateral shoulders with full RTC strength, negative neer and negative hawkin on the left; complains of achy over rhomboid and trapezius on the right; does have some discomfort over left distal rhomboid  Neurological: She is alert and oriented to person, place, and time.  Skin: Skin is warm and dry.  Psychiatric: She has a normal mood and affect. Her behavior is normal.  Nursing note and vitals reviewed.   Vitals:   07/23/17 1446  BP: 127/77  Pulse: 89  Temp: 98.3 F (36.8 C)  TempSrc: Oral  SpO2: 97%  Weight: 133 lb 9.6 oz (60.6 kg)  Height: 5\' 5"  (1.651 m)       Assessment & Plan:    Susan Daniel is a 47 y.o. female Contusion of left scapula, initial encounter - Plan: DG Scapula Left, Ambulatory referral to Physical Therapy  Upper back pain on left side - Plan: DG Scapula Left, Ambulatory referral to Physical Therapy  Muscle spasm of back - Plan: DG Scapula Left, Ambulatory referral to Physical Therapy  Prior back contusion and possible abrasion with scar tissue at left scapular area. XR obtained without fracture. Possible trigger points and spasm from episodic overuse since initial injury.   - referred to physical therapy. Recheck in 3-4 weeks with option of meeting with ortho at any point.   -Symptomatic care with ibuprofen,  heat or ice to area as needed  -RTC precautions if worsening  No orders of the defined types were placed in this encounter.  Patient Instructions   I suspect you had a contusion or bruising of the shoulder blade with secondary spasm of the muscles in your back.  Based on intermittent spasm and pain in that area still, I think you would benefit from physical therapy. I placed that referral.  Follow-up in 3 to 4 weeks to see how that is going.    At any point if you would like to meet with orthopedics I am happy to refer you.  Just let me know.    Occasional ibuprofen over-the-counter if needed for discomfort is fine for now, as well as ice or heat to that area for 15 minutes of the time if it feels better.  Return to the clinic or go to the nearest emergency room if any of your symptoms worsen or new symptoms occur.   Back Pain, Adult Many adults have back pain from time to time. Common causes of back pain include:  A strained muscle or ligament.  Wear and tear (degeneration) of the spinal disks.  Arthritis.  A hit to the back.  Back pain can be short-lived (acute) or last a long time (chronic). A physical exam, lab tests, and imaging studies may be done to find the cause of your pain. Follow these instructions at home: Managing pain and  stiffness  Take over-the-counter and prescription medicines only as told by your health care provider.  If directed, apply heat to the affected area as often as told by your health care provider. Use the heat source that your health care provider recommends, such as a moist heat pack or a heating pad. ? Place a towel between your skin and the heat source. ? Leave the heat on for 20-30 minutes. ? Remove the heat if your skin turns bright red. This is especially important if you are unable to feel pain, heat, or cold. You have a greater risk of getting burned.  If directed, apply ice to the injured area: ? Put ice in a plastic bag. ? Place a towel between your skin and the bag. ? Leave the ice on for 20 minutes, 2-3 times a day for the first 2-3 days. Activity  Do not stay in bed. Resting more than 1-2 days can delay your recovery.  Take short walks on even surfaces as soon as you are able. Try to increase the length of time you walk each day.  Do not sit, drive, or stand in one place for more than 30 minutes at a time. Sitting or standing for long periods of time can put stress on your back.  Use proper lifting techniques. When you bend and lift, use positions that put less stress on your back: ? [place] your knees. ? Keep the load close to your body. ? Avoid twisting.  Exercise regularly as told by your health care provider. Exercising will help your back heal faster. This also helps prevent back injuries by keeping muscles strong and flexible.  Your health care provider may recommend that you see a physical therapist. This person can help you come up with a safe exercise program. Do any exercises as told by your physical therapist. Lifestyle  Maintain a healthy weight. Extra weight puts stress on your back and makes it difficult to have good posture.  Avoid activities or situations that make you feel anxious or stressed. Learn ways  to manage anxiety and stress. One way to manage stress  is through exercise. Stress and anxiety increase muscle tension and can make back pain worse. General instructions  Sleep on a firm mattress in a comfortable position. Try lying on your side with your knees slightly bent. If you lie on your back, put a pillow under your knees.  Follow your treatment plan as told by your health care provider. This may include: ? Cognitive or behavioral therapy. ? Acupuncture or massage therapy. ? Meditation or yoga. Contact a health care provider if:  You have pain that is not relieved with rest or medicine.  You have increasing pain going down into your legs or buttocks.  Your pain does not improve in 2 weeks.  You have pain at night.  You lose weight.  You have a fever or chills. Get help right away if:  You develop new bowel or bladder control problems.  You have unusual weakness or numbness in your arms or legs.  You develop nausea or vomiting.  You develop abdominal pain.  You feel faint. Summary  Many adults have back pain from time to time. A physical exam, lab tests, and imaging studies may be done to find the cause of your pain.  Use proper lifting techniques. When you bend and lift, use positions that put less stress on your back.  Take over-the-counter and prescription medicines and apply heat or ice as directed by your health care provider. This information is not intended to replace advice given to you by your health care provider. Make sure you discuss any questions you have with your health care provider. Document Released: 01/02/2005 Document Revised: 02/07/2016 Document Reviewed: 02/07/2016 Elsevier Interactive Patient Education  2018 [employer].  Muscle Cramps and Spasms Muscle cramps and spasms occur when a muscle or muscles tighten and you have no control over this tightening (involuntary muscle contraction). They are a common problem and can develop in any muscle. The most common place is in the calf muscles of the  leg. Muscle cramps and muscle spasms are both involuntary muscle contractions, but there are some differences between the two:  Muscle cramps are painful. They come and go and may last a few seconds to 15 minutes. Muscle cramps are often more forceful and last longer than muscle spasms.  Muscle spasms may or may not be painful. They may also last just a few seconds or much longer.  Certain medical conditions, such as diabetes or Parkinson disease, can make it more likely to develop cramps or spasms. However, cramps or spasms are usually not caused by a serious underlying problem. Common causes include:  Overexertion.  Overuse from repetitive motions, or doing the same thing over and over.  Remaining in a certain position for a long period of time.  Improper preparation, form, or technique while playing a sport or doing an activity.  Dehydration.  Injury.  Side effects of some medicines.  Abnormally low levels of the salts and ions in your blood (electrolytes), especially potassium and calcium. This could happen if you are taking water pills (diuretics) or if you are pregnant.  In many cases, the cause of muscle cramps or spasms is unknown. Follow these instructions at home:  Stay well hydrated. Drink enough fluid to keep your urine clear or pale yellow.  Try massaging, stretching, and relaxing the affected muscle.  If directed, apply heat to tight or tense muscles as often as told by your health care provider. Use the heat  source that your health care provider recommends, such as a moist heat pack or a heating pad. ? Place a towel between your skin and the heat source. ? Leave the heat on for 20-30 minutes. ? Remove the heat if your skin turns bright red. This is especially important if you are unable to feel pain, heat, or cold. You may have a greater risk of getting burned.  If directed, put ice on the affected area. This may help if you are sore or have pain after a cramp or  spasm. ? Put ice in a plastic bag. ? Place a towel between your skin and the bag. ? Leavethe ice on for 20 minutes, 2-3 times a day.  Take over-the-counter and prescription medicines only as told by your health care provider.  Pay attention to any changes in your symptoms. Contact a health care provider if:  Your cramps or spasms get more severe or happen more often.  Your cramps or spasms do not improve over time. This information is not intended to replace advice given to you by your health care provider. Make sure you discuss any questions you have with your health care provider. Document Released: 06/24/2001 Document Revised: 02/03/2015 Document Reviewed: 10/06/2014 Elsevier Interactive Patient Education  2018 [employer].    IF you received an x-ray today, you will receive an invoice from [facility]. Please contact [facility] at [phone] with questions or concerns regarding your invoice.   IF you received labwork today, you will receive an invoice from [place]. Please contact [facility] at [phone] with questions or concerns regarding your invoice.   Our billing staff will not be able to assist you with questions regarding bills from these companies.  You will be contacted with the lab results as soon as they are available. The fastest way to get your results is to activate your My Chart account. Instructions are located on the last page of this paperwork. If you have not heard from [place] regarding the results in 2 weeks, please contact this office.       I personally performed the services described in this documentation, which was scribed in my presence. The recorded information has been reviewed and considered for accuracy and completeness, addended by me as needed, and agree with information above.  Signed,   [name], MD [facility].  07/25/17 2:28 PM

## 2017-07-25 ENCOUNTER — Encounter: Payer: Self-pay | Admitting: Family Medicine

## 2017-08-20 ENCOUNTER — Ambulatory Visit: Payer: 59 | Admitting: Family Medicine

## 2017-08-23 ENCOUNTER — Other Ambulatory Visit: Payer: Self-pay | Admitting: Obstetrics and Gynecology

## 2017-08-23 DIAGNOSIS — N6459 Other signs and symptoms in breast: Secondary | ICD-10-CM

## 2017-08-30 ENCOUNTER — Ambulatory Visit
Admission: RE | Admit: 2017-08-30 | Discharge: 2017-08-30 | Disposition: A | Discharge status: Home / Self Care | Payer: 59 | Source: Ambulatory Visit | Attending: Obstetrics and Gynecology | Admitting: Obstetrics and Gynecology

## 2017-08-30 DIAGNOSIS — N6459 Other signs and symptoms in breast: Secondary | ICD-10-CM

## 2017-11-13 ENCOUNTER — Encounter: Payer: Self-pay | Admitting: Family Medicine

## 2017-11-13 ENCOUNTER — Ambulatory Visit: Payer: No Typology Code available for payment source | Admitting: Family Medicine

## 2017-11-13 VITALS — BP 117/80 | HR 76 | Temp 98.1°F | Resp 17 | Ht 65.0 in | Wt 142.0 lb

## 2017-11-13 DIAGNOSIS — J029 Acute pharyngitis, unspecified: Secondary | ICD-10-CM

## 2017-11-13 DIAGNOSIS — K0889 Other specified disorders of teeth and supporting structures: Secondary | ICD-10-CM

## 2017-11-13 DIAGNOSIS — Z20818 Contact with and (suspected) exposure to other bacterial communicable diseases: Secondary | ICD-10-CM

## 2017-11-13 DIAGNOSIS — R590 Localized enlarged lymph nodes: Secondary | ICD-10-CM

## 2017-11-13 MED ORDER — AMOXICILLIN 500 MG PO CAPS: 500.0000 mg | ORAL_CAPSULE | Freq: Three times a day (TID) | ORAL | 0 refills | Status: DC

## 2017-11-13 NOTE — Progress Notes (Signed)
Subjective:  By signing my name below, I, Susan Daniel, attest that this documentation has been prepared under the direction and in the presence of [name], MD. Electronically Signed: [name], [place]. 11/13/2017 , 2:21 PM .  Patient was seen in Room 14 .   Patient ID: Susan Daniel, female    DOB: Jun 07, 1970, 47 y.o.   MRN: [id number] Chief Complaint  Patient presents with  . Neck Pain   HPI Susan Daniel is a 47 y.o. female  Patient reports having neck pain and soreness that started about 1 week ago. She was at a [place] party about a week ago, with a child attending who had strep the next day, as well as people at work being sick. Her sinuses have been bothering her. She denies measuring a fever. She notes initially feeling sore glands in her neck. She's noticed some sinus congestion which she's been taking nasal decongestants, and taking advil for headache. She denies any recent twists or turns. She's also noticed some hot/cold sensitivity in her back teeth recently. She has been on antibiotics in the past for dental abscess, though she notes this feels different. She hasn't seen her dentist recently as she has teeth cleaning appointment coming up. She's been taking ibuprofen 2 tablets once-twice a day. She also mentions some right ear pain and right eye pressure around her sinuses.   There are no active problems to display for this patient.  Past Medical History:  Diagnosis Date  . Allergy   . Arthritis    No past surgical history on file. Allergies  Allergen Reactions  . Codeine   . Hydrocodone   . Sulfa Antibiotics    Prior to Admission medications   Medication Sig Start Date End Date Taking? Authorizing Provider  ibuprofen (ADVIL,MOTRIN) 200 MG tablet Take 200 mg by mouth every 6 (six) hours as needed.    [provider]  levonorgestrel (MIRENA) 20 MCG/24HR IUD 1 each by Intrauterine route once.      [provider]  OVER THE COUNTER  MEDICATION daily as needed.    [provider]  triamcinolone (NASACORT) 55 MCG/ACT AERO nasal inhaler Place 2 sprays daily into the nose. 11/28/16   Horald Pollen, MD   Social History   Socioeconomic History  . Marital status: Married    Spouse name: Not on file  . Number of children: 1  . Years of education: Not on file  . Highest education level: Not on file  Occupational History  . Occupation: Control and instrumentation engineer: [employer]  . Financial resource strain: Not on file  . Food insecurity:    Worry: Not on file    Inability: Not on file  . Transportation needs:    Medical: Not on file    Non-medical: Not on file  Tobacco Use  . Smoking status: Current Every Day Smoker  . Smokeless tobacco: Never Used  Substance and Sexual Activity  . Alcohol use: Yes    Comment: ocassionally  . Drug use: No  . Sexual activity: Not on file  Lifestyle  . Physical activity:    Days per week: Not on file    Minutes per session: Not on file  . Stress: Not on file  Relationships  . Social connections:    Talks on phone: Not on file    Gets together: Not on file    Attends religious service: Not on file  Active member of club or organization: Not on file    Attends meetings of clubs or organizations: Not on file    Relationship status: Not on file  . Intimate partner violence:    Fear of current or ex partner: Not on file    Emotionally abused: Not on file    Physically abused: Not on file    Forced sexual activity: Not on file  Other Topics Concern  . Not on file  Social History Narrative  . Not on file   Review of Systems  Constitutional: Negative for chills, fatigue, fever and unexpected weight change.  HENT: Positive for congestion, ear pain, sinus pressure and sore throat.   Respiratory: Negative for cough.   Gastrointestinal: Negative for constipation, diarrhea, nausea and vomiting.  Musculoskeletal: Positive for neck  pain.  Skin: Negative for rash and wound.  Neurological: Positive for headaches. Negative for dizziness and weakness.       Objective:   Physical Exam  Constitutional: She is oriented to person, place, and time. She appears well-developed and well-nourished. No distress.  HENT:  Head: Normocephalic and atraumatic.  Right Ear: Tympanic membrane is erythematous.  Left Ear: Tympanic membrane is erythematous.  Nose: Right sinus exhibits no maxillary sinus tenderness and no frontal sinus tenderness. Left sinus exhibits no maxillary sinus tenderness and no frontal sinus tenderness.  Teeth: some decay on 2nd molar on lower right, few other areas of decay in lower teeth but no surrounding abscess or significant gum edema Jaw: TMJ non tender Ears: minimal erythema, no exudate, TM's pearly gray No facial swelling  Eyes: Pupils are equal, round, and reactive to light. EOM are normal.  Neck: Neck supple.  Posterior neck non tender, describes some soreness in paraspinal muscles on the upper neck, no midline tenderness  Cardiovascular: Normal rate.  Pulmonary/Chest: Effort normal. No respiratory distress.  Musculoskeletal: Normal range of motion.  Lymphadenopathy:  Possible submandibular lymph node tenderness on the right, submandibular gland doesn't appear enlarged, AC nodes non tender  Neurological: She is alert and oriented to person, place, and time.  Skin: Skin is warm and dry.  Psychiatric: She has a normal mood and affect. Her behavior is normal.  Nursing note and vitals reviewed.   Vitals:   11/13/17 1342  BP: 117/80  Pulse: 76  Resp: 17  Temp: 98.1 F (36.7 C)  TempSrc: Oral  SpO2: 98%  Weight: 142 lb (64.4 kg)  Height: 5\' 5"  (1.651 m)       Assessment & Plan:    Susan Daniel is a 47 y.o. female Sore throat - Plan: amoxicillin (AMOXIL) 500 MG capsule  LAD (lymphadenopathy), submandibular  Pain, dental - Plan: amoxicillin (AMOXIL) 500 MG capsule  Exposure to  strep throat  Exposure to strep throat, lymphadenopathy present with sore throat/neck pain.  Does also have some dental caries, differential includes early periodontal abscess but none seen on exam at present.   -Start amoxicillin 500 mg 3 times daily, potential side effects discussed, RTC precautions if not improving or worsening symptoms.  -For sinus congestion, saline nasal spray discussed.  -Follow-up with dentist as soon as possible  -RTC/ER precautions  Meds ordered this encounter  Medications  . amoxicillin (AMOXIL) 500 MG capsule    Sig: Take 1 capsule (500 mg total) by mouth 3 (three) times daily.    Dispense:  30 capsule    Refill:  0   Patient Instructions    Start amoxicillin for possible infection causing swollen lymph  node on the right jawline, as well as with exposure to strep throat.  Make sure to follow-up with your dentist as soon as possible, but if any fevers, or worsening symptoms, be seen right away.  For sinus congestion and headache, okay to use saline nasal spray 4-5 times per day, ibuprofen up to 600 mg every 6-8 hours as needed with food.  Return to the clinic or go to the nearest emergency room if any of your symptoms worsen or new symptoms occur.  Lymphadenopathy Lymphadenopathy refers to swollen or enlarged lymph glands, also called lymph nodes. Lymph glands are part of your body's defense (immune) system, which protects the body from infections, germs, and diseases. Lymph glands are found in many locations in your body, including the neck, underarm, and groin. Many things can cause lymph glands to become enlarged. When your immune system responds to germs, such as viruses or bacteria, infection-fighting cells and fluid build up. This causes the glands to grow in size. Usually, this is not something to worry about. The swelling and any soreness often go away without treatment. However, swollen lymph glands can also be caused by a number of diseases. Your health  care provider may do various tests to help determine the cause. If the cause of your swollen lymph glands cannot be found, it is important to monitor your condition to make sure the swelling goes away. Follow these instructions at home: Watch your condition for any changes. The following actions may help to lessen any discomfort you are feeling:  Get plenty of rest.  Take medicines only as directed by your health care provider. Your health care provider may recommend over-the-counter medicines for pain.  Apply moist heat compresses to the site of swollen lymph nodes as directed by your health care provider. This can help reduce any pain.  Check your lymph nodes daily for any changes.  Keep all follow-up visits as directed by your health care provider. This is important.  Contact a health care provider if:  Your lymph nodes are still swollen after 2 weeks.  Your swelling increases or spreads to other areas.  Your lymph nodes are hard, seem fixed to the skin, or are growing rapidly.  Your skin over the lymph nodes is red and inflamed.  You have a fever.  You have chills.  You have fatigue.  You develop a sore throat.  You have abdominal pain.  You have weight loss.  You have night sweats. Get help right away if:  You notice fluid leaking from the area of the enlarged lymph node.  You have severe pain in any area of your body.  You have chest pain.  You have shortness of breath. This information is not intended to replace advice given to you by your health care provider. Make sure you discuss any questions you have with your health care provider. Document Released: 10/12/2007 Document Revised: 06/10/2015 Document Reviewed: 08/07/2013 Elsevier Interactive Patient Education  2018 [place] Pain Dental pain may be caused by many things, including:  Tooth decay (cavities or caries). Cavities expose the nerve of your tooth to air and hot or cold temperatures.  This can cause pain or discomfort.  Abscess or infection. A dental abscess is a collection of infected pus from a bacterial infection in the inner part of the tooth (pulp). It usually occurs at the end of the tooth's root.  Injury.  An unknown reason (idiopathic).  Your pain may be mild or severe. It  may only occur when:  You are chewing.  You are exposed to hot or cold temperature.  You are eating or drinking sugary foods or beverages, such as soda or candy.  Your pain may also be constant. Follow these instructions at home: Watch your dental pain for any changes. The following actions may help to lessen any discomfort that you are feeling:  Take medicines only as directed by your dentist.  If you were prescribed an antibiotic medicine, finish all of it even if you start to feel better.  Keep all follow-up visits as directed by your dentist. This is important.  Do not apply heat to the outside of your face.  Rinse your mouth or gargle with salt water if directed by your dentist. This helps with pain and swelling. ? You can make salt water by adding  tsp of salt to 1 cup of warm water.  Apply ice to the painful area of your face: ? Put ice in a plastic bag. ? Place a towel between your skin and the bag. ? Leave the ice on for 20 minutes, 2-3 times per day.  Avoid foods or drinks that cause you pain, such as: ? Very hot or very cold foods or drinks. ? Sweet or sugary foods or drinks.  Contact a health care provider if:  Your pain is not controlled with medicines.  Your symptoms are worse.  You have new symptoms. Get help right away if:  You are unable to open your mouth.  You are having trouble breathing or swallowing.  You have a fever.  Your face, neck, or jaw is swollen. This information is not intended to replace advice given to you by your health care provider. Make sure you discuss any questions you have with your health care provider. Document Released:  01/02/2005 Document Revised: 05/13/2015 Document Reviewed: 12/29/2013 Elsevier Interactive Patient Education  [employer].    If you have lab work done today you will be contacted with your lab results within the next 2 weeks.  If you have not heard from [place] then please contact us. The fastest way to get your results is to register for My Chart.   IF you received an x-ray today, you will receive an invoice from [facility]. Please contact [facility] at [phone] with questions or concerns regarding your invoice.   IF you received labwork today, you will receive an invoice from [place]. Please contact [facility] at [phone] with questions or concerns regarding your invoice.   Our billing staff will not be able to assist you with questions regarding bills from these companies.  You will be contacted with the lab results as soon as they are available. The fastest way to get your results is to activate your My Chart account. Instructions are located on the last page of this paperwork. If you have not heard from [place] regarding the results in 2 weeks, please contact this office.       I personally performed the services described in this documentation, which was scribed in my presence. The recorded information has been reviewed and considered for accuracy and completeness, addended by me as needed, and agree with information above.  Signed,   [name], MD [facility].  11/15/17 9:44 PM

## 2017-11-13 NOTE — Patient Instructions (Addendum)
Start amoxicillin for possible infection causing swollen lymph node on the right jawline, as well as with exposure to strep throat.  Make sure to follow-up with your dentist as soon as possible, but if any fevers, or worsening symptoms, be seen right away.  For sinus congestion and headache, okay to use saline nasal spray 4-5 times per day, ibuprofen up to 600 mg every 6-8 hours as needed with food.  Return to the clinic or go to the nearest emergency room if any of your symptoms worsen or new symptoms occur.  Lymphadenopathy Lymphadenopathy refers to swollen or enlarged lymph glands, also called lymph nodes. Lymph glands are part of your body's defense (immune) system, which protects the body from infections, germs, and diseases. Lymph glands are found in many locations in your body, including the neck, underarm, and groin. Many things can cause lymph glands to become enlarged. When your immune system responds to germs, such as viruses or bacteria, infection-fighting cells and fluid build up. This causes the glands to grow in size. Usually, this is not something to worry about. The swelling and any soreness often go away without treatment. However, swollen lymph glands can also be caused by a number of diseases. Your health care provider may do various tests to help determine the cause. If the cause of your swollen lymph glands cannot be found, it is important to monitor your condition to make sure the swelling goes away. Follow these instructions at home: Watch your condition for any changes. The following actions may help to lessen any discomfort you are feeling:  Get plenty of rest.  Take medicines only as directed by your health care provider. Your health care provider may recommend over-the-counter medicines for pain.  Apply moist heat compresses to the site of swollen lymph nodes as directed by your health care provider. This can help reduce any pain.  Check your lymph nodes daily for any  changes.  Keep all follow-up visits as directed by your health care provider. This is important.  Contact a health care provider if:  Your lymph nodes are still swollen after 2 weeks.  Your swelling increases or spreads to other areas.  Your lymph nodes are hard, seem fixed to the skin, or are growing rapidly.  Your skin over the lymph nodes is red and inflamed.  You have a fever.  You have chills.  You have fatigue.  You develop a sore throat.  You have abdominal pain.  You have weight loss.  You have night sweats. Get help right away if:  You notice fluid leaking from the area of the enlarged lymph node.  You have severe pain in any area of your body.  You have chest pain.  You have shortness of breath. This information is not intended to replace advice given to you by your health care provider. Make sure you discuss any questions you have with your health care provider. Document Released: 10/12/2007 Document Revised: 06/10/2015 Document Reviewed: 08/07/2013 Elsevier Interactive Patient Education  2018 [place] Pain Dental pain may be caused by many things, including:  Tooth decay (cavities or caries). Cavities expose the nerve of your tooth to air and hot or cold temperatures. This can cause pain or discomfort.  Abscess or infection. A dental abscess is a collection of infected pus from a bacterial infection in the inner part of the tooth (pulp). It usually occurs at the end of the tooth's root.  Injury.  An unknown reason (idiopathic).  Your  pain may be mild or severe. It may only occur when:  You are chewing.  You are exposed to hot or cold temperature.  You are eating or drinking sugary foods or beverages, such as soda or candy.  Your pain may also be constant. Follow these instructions at home: Watch your dental pain for any changes. The following actions may help to lessen any discomfort that you are feeling:  Take medicines only as  directed by your dentist.  If you were prescribed an antibiotic medicine, finish all of it even if you start to feel better.  Keep all follow-up visits as directed by your dentist. This is important.  Do not apply heat to the outside of your face.  Rinse your mouth or gargle with salt water if directed by your dentist. This helps with pain and swelling. ? You can make salt water by adding  tsp of salt to 1 cup of warm water.  Apply ice to the painful area of your face: ? Put ice in a plastic bag. ? Place a towel between your skin and the bag. ? Leave the ice on for 20 minutes, 2-3 times per day.  Avoid foods or drinks that cause you pain, such as: ? Very hot or very cold foods or drinks. ? Sweet or sugary foods or drinks.  Contact a health care provider if:  Your pain is not controlled with medicines.  Your symptoms are worse.  You have new symptoms. Get help right away if:  You are unable to open your mouth.  You are having trouble breathing or swallowing.  You have a fever.  Your face, neck, or jaw is swollen. This information is not intended to replace advice given to you by your health care provider. Make sure you discuss any questions you have with your health care provider. Document Released: 01/02/2005 Document Revised: 05/13/2015 Document Reviewed: 12/29/2013 Elsevier Interactive Patient Education  [employer].    If you have lab work done today you will be contacted with your lab results within the next 2 weeks.  If you have not heard from [place] then please contact us. The fastest way to get your results is to register for My Chart.   IF you received an x-ray today, you will receive an invoice from [facility]. Please contact [facility] at [phone] with questions or concerns regarding your invoice.   IF you received labwork today, you will receive an invoice from [place]. Please contact [facility] at [phone] with questions or  concerns regarding your invoice.   Our billing staff will not be able to assist you with questions regarding bills from these companies.  You will be contacted with the lab results as soon as they are available. The fastest way to get your results is to activate your My Chart account. Instructions are located on the last page of this paperwork. If you have not heard from [place] regarding the results in 2 weeks, please contact this office.

## 2020-01-26 ENCOUNTER — Other Ambulatory Visit: Payer: Self-pay | Admitting: Obstetrics and Gynecology

## 2020-01-26 DIAGNOSIS — R928 Other abnormal and inconclusive findings on diagnostic imaging of breast: Secondary | ICD-10-CM

## 2020-01-27 ENCOUNTER — Other Ambulatory Visit: Payer: Self-pay | Admitting: Obstetrics and Gynecology

## 2020-01-28 ENCOUNTER — Other Ambulatory Visit: Payer: Self-pay | Admitting: Obstetrics and Gynecology

## 2020-01-28 DIAGNOSIS — Z9189 Other specified personal risk factors, not elsewhere classified: Secondary | ICD-10-CM

## 2020-02-09 ENCOUNTER — Ambulatory Visit
Admission: RE | Admit: 2020-02-09 | Discharge: 2020-02-09 | Disposition: A | Discharge status: Home / Self Care | Payer: 59 | Source: Ambulatory Visit | Attending: Obstetrics and Gynecology | Admitting: Obstetrics and Gynecology

## 2020-02-09 ENCOUNTER — Other Ambulatory Visit: Payer: Self-pay | Admitting: Obstetrics and Gynecology

## 2020-02-09 DIAGNOSIS — R928 Other abnormal and inconclusive findings on diagnostic imaging of breast: Secondary | ICD-10-CM

## 2020-02-10 ENCOUNTER — Ambulatory Visit
Admission: RE | Admit: 2020-02-10 | Discharge: 2020-02-10 | Disposition: A | Discharge status: Home / Self Care | Payer: No Typology Code available for payment source | Source: Ambulatory Visit | Attending: Obstetrics and Gynecology | Admitting: Obstetrics and Gynecology

## 2020-02-10 ENCOUNTER — Other Ambulatory Visit: Payer: Self-pay

## 2020-02-10 DIAGNOSIS — C50919 Malignant neoplasm of unspecified site of unspecified female breast: Secondary | ICD-10-CM

## 2020-02-10 DIAGNOSIS — R928 Other abnormal and inconclusive findings on diagnostic imaging of breast: Secondary | ICD-10-CM

## 2020-02-10 HISTORY — DX: Malignant neoplasm of unspecified site of unspecified female breast: C50.919

## 2020-02-10 HISTORY — PX: BREAST BIOPSY: SHX20

## 2020-02-12 ENCOUNTER — Telehealth: Payer: Self-pay | Admitting: Hematology

## 2020-02-12 NOTE — Telephone Encounter (Signed)
Spoke to patient to confirm afternoon [facility] appointment for 2/2, sent packet via email

## 2020-02-13 ENCOUNTER — Telehealth: Payer: Self-pay | Admitting: *Deleted

## 2020-02-13 ENCOUNTER — Encounter: Payer: Self-pay | Admitting: *Deleted

## 2020-02-13 DIAGNOSIS — C50512 Malignant neoplasm of lower-outer quadrant of left female breast: Secondary | ICD-10-CM

## 2020-02-13 NOTE — Telephone Encounter (Signed)
Spoke to pt at length regarding b[facility]c vs seeing physician team individually. After a lengthy discussion pt has decided to see the physician team she would like in her care individually. Msg sent to get pt in to see Dr. Georgette Dover, Dr.Magrinat and Dr. Lisbeth Renshaw.  Provided pt with navigation resources and contact information.

## 2020-02-16 ENCOUNTER — Other Ambulatory Visit: Payer: Self-pay | Admitting: *Deleted

## 2020-02-16 ENCOUNTER — Telehealth: Payer: Self-pay | Admitting: Radiation Oncology

## 2020-02-16 DIAGNOSIS — Z170 Estrogen receptor positive status [ER+]: Secondary | ICD-10-CM

## 2020-02-16 NOTE — Progress Notes (Signed)
Location of Breast Cancer: Left Breast  Did patient present with symptoms (if so, please note symptoms) or was this found on screening mammography?: Routine Mammogram   Mammogram 01/23/2020: In left breast, a possible mass with distortion warrants further evaluation.  In the right breast, no findings suspicious for malignancy.  Histology per Pathology Report: Left Breast 02/10/2020   Receptor Status: ER(+80%), PR (+100%), Her2-neu (-), Ki-67(10%)    Past/Anticipated interventions by surgeon, if any: Dr. Georgette Dover 02/20/2020   Past/Anticipated interventions by medical oncology, if any: Chemotherapy    Lymphedema issues, if any:  No  Pain issues, if any:  No  SAFETY ISSUES:  Prior radiation? No  Pacemaker/ICD? No  Possible current pregnancy? IUD  Is the patient on methotrexate? No  Current Complaints / other details:      [name], RN 02/16/2020,3:44 PM

## 2020-02-16 NOTE — Telephone Encounter (Signed)
Spoke to patient in reference to [facility], advise that clinic spots have been closed and would work on getting other appointments scheduled, I was able to go ahead and confirm Radiation Oncology appointment for 2/1, patient understood appointment time and location will call back with medical oncology appointment later today

## 2020-02-17 ENCOUNTER — Other Ambulatory Visit: Payer: Self-pay | Admitting: Oncology

## 2020-02-17 ENCOUNTER — Other Ambulatory Visit: Payer: No Typology Code available for payment source

## 2020-02-17 ENCOUNTER — Ambulatory Visit
Admission: RE | Admit: 2020-02-17 | Discharge: 2020-02-17 | Disposition: A | Discharge status: Home / Self Care | Payer: No Typology Code available for payment source | Source: Ambulatory Visit | Attending: Radiation Oncology | Admitting: Radiation Oncology

## 2020-02-17 ENCOUNTER — Other Ambulatory Visit: Payer: Self-pay

## 2020-02-17 ENCOUNTER — Encounter: Payer: Self-pay | Admitting: Radiation Oncology

## 2020-02-17 VITALS — BP 116/52 | HR 89 | Temp 97.3°F | Resp 18 | Ht 65.5 in | Wt 145.2 lb

## 2020-02-17 DIAGNOSIS — F17210 Nicotine dependence, cigarettes, uncomplicated: Secondary | ICD-10-CM | POA: Insufficient documentation

## 2020-02-17 DIAGNOSIS — Z79899 Other long term (current) drug therapy: Secondary | ICD-10-CM | POA: Diagnosis not present

## 2020-02-17 DIAGNOSIS — C50512 Malignant neoplasm of lower-outer quadrant of left female breast: Secondary | ICD-10-CM | POA: Insufficient documentation

## 2020-02-17 DIAGNOSIS — Z170 Estrogen receptor positive status [ER+]: Secondary | ICD-10-CM | POA: Diagnosis not present

## 2020-02-17 NOTE — Progress Notes (Addendum)
Radiation Oncology         [phone] ________________________________  Name: Susan Daniel        MRN: [id number]  Date of Service: 02/17/2020 DOB: [birth year]  OF:BPZWCH, Ranell Patrick, MD  Magrinat, Virgie Dad, MD     REFERRING PHYSICIAN: Magrinat, Virgie Dad, MD   DIAGNOSIS: The encounter diagnosis was Malignant neoplasm of lower-outer quadrant of left breast of female, estrogen receptor positive ([place]).   HISTORY OF PRESENT ILLNESS: Susan Daniel is a 50 y.o. female with a new diagnosis of left breast cancer. The patient was noted to have a history of thickening of the skin of the left breast in 2019 and further work up revealed a fibroadenomatous change at 5:30 measuring about 11 mm. She was to have routine screening mammogram in one year. She had repeat imaging of the breast at the breast center on 02/09/20 and this showed persistent spiculated mass in the left lower breast. An ultrasound measured an 8 mm lesion in the left breast at 6:00, and in the 5:30 position also measuring 1.4 cm. Her axilla was negative for adenopathy. She underwent a biopsy of the left breast at the 6:00 position which revealed an invasive mammary carcinoma with lobular features and also described ductal phenotype with calcifications. Her cancer was grade 2 and was ER/PR positive, HER2 negative with a Ki 67 of 10%. She's seen today to discuss treatment options of her cancer. She meets with Dr. Georgette Dover on Friday.    PREVIOUS RADIATION THERAPY: No   PAST MEDICAL HISTORY:  Past Medical History:  Diagnosis Date  . Allergy   . Arthritis   . Breast cancer ([place]) 02/10/2020   Left       PAST SURGICAL HISTORY: Past Surgical History:  Procedure Laterality Date  . BREAST BIOPSY Left 02/10/2020     FAMILY HISTORY:  Family History  Problem Relation Age of Onset  . Heart disease Paternal Grandfather   . Aneurysm Paternal Grandfather   . Diabetes Maternal Grandmother   . Heart disease Maternal Grandmother   .  Breast cancer Mother   . Kidney disease Maternal Grandfather   . Irritable bowel syndrome Other        Cousins  . Kidney disease Maternal Uncle      SOCIAL HISTORY:  reports that she has been smoking. She has never used smokeless tobacco. She reports current alcohol use. She reports that she does not use drugs. 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 She lives in [place], and has a 72 year old daughter she's worried about telling her diagnosis to.   ALLERGIES: Codeine, Hydrocodone, and Sulfa antibiotics   MEDICATIONS:  Current Outpatient Medications  Medication Sig Dispense Refill  . ibuprofen (ADVIL,MOTRIN) 200 MG tablet Take 200 mg by mouth every 6 (six) hours as needed.    Marland Kitchen levonorgestrel (MIRENA) 20 MCG/24HR IUD 1 each by Intrauterine route once.    . triamcinolone (KENALOG) 0.1 % triamcinolone acetonide 0.1 % topical cream  1 APPLICATION APPLY ON THE SKIN TWICE A DAY AS NEEDED 2 WEEKS ON/OFF    . triamcinolone (NASACORT) 55 MCG/ACT AERO nasal inhaler Place 2 sprays daily into the nose. (Patient not taking: No sig reported) 1 Inhaler 12   No current facility-administered medications for this encounter.     REVIEW OF SYSTEMS: On review of systems, the patient reports that she is doing well overall. She denies any specific concerns about her breast but  is concerned about the severity of her diagnosis as her mother died of breast cancer. She apparently had mastectomy with node positive disease, radiation and chemo. The patient verbalized concerns about how to tell her 22 year old daughter about her diagnosis. No other complaints are verbalized.    PHYSICAL EXAM:  Wt Readings from Last 3 Encounters:  02/17/20 145 lb 4 oz (65.9 kg)  11/13/17 142 lb (64.4 kg)  07/23/17 133 lb 9.6 oz (60.6 kg)   Temp Readings from Last 3 Encounters:  02/17/20 (!) 97.3 F (36.3 C) (Temporal)  11/13/17 98.1 F (36.7 C) (Oral)  07/23/17 98.3 F  (36.8 C) (Oral)   BP Readings from Last 3 Encounters:  02/17/20 (!) 116/52  11/13/17 117/80  07/23/17 127/77   Pulse Readings from Last 3 Encounters:  02/17/20 89  11/13/17 76  07/23/17 89    In general this is a well appearing caucasian female in no acute distress. She's alert and oriented x4 and appropriate throughout the examination. Cardiopulmonary assessment is negative for acute distress and she exhibits normal effort. Bilateral breast exam is deferred.    ECOG = 0  0 - Asymptomatic (Fully active, able to carry on all predisease activities without restriction)  1 - Symptomatic but completely ambulatory (Restricted in physically strenuous activity but ambulatory and able to carry out work of a light or sedentary nature. For example, light housework, office work)  2 - Symptomatic, <50% in bed during the day (Ambulatory and capable of all self care but unable to carry out any work activities. Up and about more than 50% of waking hours)  3 - Symptomatic, >50% in bed, but not bedbound (Capable of only limited self-care, confined to bed or chair 50% or more of waking hours)  4 - Bedbound (Completely disabled. Cannot carry on any self-care. Totally confined to bed or chair)  5 - Death   Eustace Pen MM, Creech RH, Tormey DC, et al. [phone]). "Toxicity and response criteria of the [facility]". [place] Oncol. 5 (6): 649-55    LABORATORY DATA:  Lab Results  Component Value Date   WBC 5.5 10/25/2012   HGB 14.8 10/25/2012   HCT 46.0 10/25/2012   MCV 98.5 (A) 10/25/2012   PLT 252 12/06/2007   Lab Results  Component Value Date   NA 138 10/25/2012   K 4.1 10/25/2012   CL 107 10/25/2012   CO2 26 10/25/2012   Lab Results  Component Value Date   ALT 12 10/25/2012   AST 12 10/25/2012   ALKPHOS 35 (L) 10/25/2012   BILITOT 0.4 10/25/2012      RADIOGRAPHY: US BREAST LTD UNI LEFT INC AXILLA  Addendum Date: 02/11/2020   ADDENDUM REPORT: 02/11/2020 09:30  ADDENDUM: Ultrasound of the left axilla is negative. Electronically Signed   By: Abelardo Diesel M.D.   On: 02/11/2020 09:30   Result Date: 02/11/2020 CLINICAL DATA:  Callback from screening mammogram for possible mass with distortion left breast EXAM: DIGITAL DIAGNOSTIC LEFT MAMMOGRAM WITH CAD AND TOMOSYNTHESIS ULTRASOUND left BREAST TECHNIQUE: Left digital diagnostic mammography and breast tomosynthesis was performed. Digital images of the left breast were evaluated with computer-aided detection. Targeted ultrasound examination of the left breast was performed. COMPARISON:  Prior films ACR Breast Density Category c: The breast tissue is heterogeneously dense, which may obscure small masses. FINDINGS: Lateral view of left breast, spot compression cc and lateral views of the left breast are submitted. Previously questioned mass with spiculation persists on additional  views in the lower left breast. An adjacent oval mass is identified. Targeted ultrasound is performed, showing 0.8 x 0 5 x 0.8 cm irregular spiculated mass at the left breast 6 o'clock 2 cm from nipple correlating to the callback mass with spiculation. At the left breast 5:30 o'clock 3 cm from nipple, there is hypoechoic oval mass measuring 1.4 x 0.8 x 0.6 cm. This is unchanged compared to prior ultrasound August 2019. IMPRESSION: Highly suspicious findings. RECOMMENDATION: Ultrasound-guided core biopsy of spiculated mass at left breast 6 o'clock position. Consider biopsy of the left breast 5:30 o'clock lesion to establish tissue diagnosis prior to surgical excision for the left breast 6 o'clock lesion I have discussed the findings and recommendations with the patient. If applicable, a reminder letter will be sent to the patient regarding the next appointment. BI-RADS CATEGORY  5: Highly suggestive of malignancy. Electronically Signed: By: Abelardo Diesel M.D. On: 02/09/2020 13:08   MM DIAG BREAST TOMO UNI LEFT  Addendum Date: 02/11/2020   ADDENDUM  REPORT: 02/11/2020 09:30 ADDENDUM: Ultrasound of the left axilla is negative. Electronically Signed   By: Abelardo Diesel M.D.   On: 02/11/2020 09:30   Result Date: 02/11/2020 CLINICAL DATA:  Callback from screening mammogram for possible mass with distortion left breast EXAM: DIGITAL DIAGNOSTIC LEFT MAMMOGRAM WITH CAD AND TOMOSYNTHESIS ULTRASOUND left BREAST TECHNIQUE: Left digital diagnostic mammography and breast tomosynthesis was performed. Digital images of the left breast were evaluated with computer-aided detection. Targeted ultrasound examination of the left breast was performed. COMPARISON:  Prior films ACR Breast Density Category c: The breast tissue is heterogeneously dense, which may obscure small masses. FINDINGS: Lateral view of left breast, spot compression cc and lateral views of the left breast are submitted. Previously questioned mass with spiculation persists on additional views in the lower left breast. An adjacent oval mass is identified. Targeted ultrasound is performed, showing 0.8 x 0 5 x 0.8 cm irregular spiculated mass at the left breast 6 o'clock 2 cm from nipple correlating to the callback mass with spiculation. At the left breast 5:30 o'clock 3 cm from nipple, there is hypoechoic oval mass measuring 1.4 x 0.8 x 0.6 cm. This is unchanged compared to prior ultrasound August 2019. IMPRESSION: Highly suspicious findings. RECOMMENDATION: Ultrasound-guided core biopsy of spiculated mass at left breast 6 o'clock position. Consider biopsy of the left breast 5:30 o'clock lesion to establish tissue diagnosis prior to surgical excision for the left breast 6 o'clock lesion I have discussed the findings and recommendations with the patient. If applicable, a reminder letter will be sent to the patient regarding the next appointment. BI-RADS CATEGORY  5: Highly suggestive of malignancy. Electronically Signed: By: Abelardo Diesel M.D. On: 02/09/2020 13:08   MM CLIP PLACEMENT LEFT  Result Date:  02/10/2020 CLINICAL DATA:  Evaluate RIBBON biopsy clip placement following ultrasound-guided LEFT breast biopsy. EXAM: DIAGNOSTIC LEFT MAMMOGRAM POST ULTRASOUND BIOPSY COMPARISON:  Previous exam(s). FINDINGS: Mammographic images were obtained following ultrasound guided biopsy of the 0.8 cm mass at the 6 o'clock position of the LEFT breast. The RIBBON biopsy marking clip is in expected position at the site of biopsy. IMPRESSION: Appropriate positioning of the RIBBON shaped biopsy marking clip at the site of biopsy in the LOWER LEFT breast. Final Assessment: Post Procedure Mammograms for Marker Placement Electronically Signed   By: Margarette Canada M.D.   On: 02/10/2020 12:13   [place] LT BREAST BX W LOC DEV 1ST LESION IMG BX SPEC US GUIDE  Addendum Date: 02/11/2020  ADDENDUM REPORT: 02/11/2020 13:48 ADDENDUM: Pathology revealed GRADE II INVASIVE MAMMARY CARCINOMA WITH LOBULAR FEATURES AND CALCIFICATIONS of the LEFT breast, 6 o'clock. This was found to be concordant by Dr. Hassan Rowan. Pathology results were discussed with the patient by telephone. The patient reported doing well after the biopsy with tenderness at the site. Post biopsy instructions and care were reviewed and questions were answered. The patient was encouraged to call [facility] for any additional concerns. The patient was referred to [facility] on February 18, 2020. Pathology results reported by Stacie Acres RN on 02/11/2020. Electronically Signed   By: Margarette Canada M.D.   On: 02/11/2020 13:48   Result Date: 02/11/2020 CLINICAL DATA:  50 year old female for tissue sampling of 0.8 cm LOWER LEFT breast mass at the 6 o'clock position. On the 02/09/2020 diagnostic mammogram/ultrasound, a 1.4 cm mass was identified at the 5:30 position of the LEFT breast with consideration of sampling. However, given stability sonographically from 08/30/2017 and  mammographically from 2012, this is a benign mass and sampling will not be performed. EXAM: ULTRASOUND GUIDED LEFT BREAST CORE NEEDLE BIOPSY COMPARISON:  Previous exam(s). PROCEDURE: I met with the patient and we discussed the procedure of ultrasound-guided biopsy, including benefits and alternatives. We discussed the high likelihood of a successful procedure. We discussed the risks of the procedure, including infection, bleeding, tissue injury, clip migration, and inadequate sampling. Informed written consent was given. The usual time-out protocol was performed immediately prior to the procedure. Using sterile technique and 1% Lidocaine as local anesthetic, under direct ultrasound visualization, a 12 gauge spring-loaded device was used to perform biopsy of 0.8 cm mass at the 6 o'clock position of the LEFT breast 2 cm from the nipple using a MEDIAL approach. At the conclusion of the procedure a RIBBON tissue marker clip was deployed into the biopsy cavity. Follow up 2 view mammogram was performed and dictated separately. IMPRESSION: Ultrasound guided biopsy of 0.8 cm LOWER LEFT breast mass. No apparent complications. 1.4 cm LEFT breast mass identified at the 5:30 position on the 02/09/2020 diagnostic mammogram/ultrasound was not sampled today as this is a benign mass given stability sonographically and mammographically as discussed above. Electronically Signed: By: Margarette Canada M.D. On: 02/10/2020 12:14       IMPRESSION/PLAN: 1. 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 Dr. Lisbeth Renshaw discusses the pathology findings and reviews the nature of early left breast disease. The patient's case will be discussed tomorrow in breast conference. We suspect that Dr. Georgette Dover will weigh in more specifically about the possible need for MRI with the density and lobular features. She may not need this however, but she would likely be a candidate for lumpectomy with  sentinel node biopsy. Dr. Jana Hakim would likely order Oncotype Dx score to determine a role for systemic therapy. Provided that chemotherapy is not indicated, the patient's course would then be followed by external radiotherapy to the breast  to reduce risks of local recurrence followed by antiestrogen therapy. We discussed the risks, benefits, short, and long term effects of radiotherapy, as well as the curative intent, and the patient is interested in proceeding. Dr. Lisbeth Renshaw discusses the delivery and logistics of radiotherapy and anticipates a course of 6 1/2 weeks of radiotherapy to the left breast with deep inspiration breath hold technique.  We will see her back a few weeks after surgery to  discuss the simulation process and anticipate we starting radiotherapy about 4-6 weeks after surgery.  2. Social concerns. We've reached out to social work to make sure they are aware of her concerns about sharing her diagnosis with her young daughter.  3. Possible genetic predisposition to malignancy. The patient is a candidate for genetic testing given her personal and family history. She was offered referral and agreed. Subsequently referral was placed. 4. Covid vaccination status. The patient has not been vaccinated for covid 19. While radiation would not cause her immune system to be compromised, she will be in the health system considerably more often than she's used to for treatment in the midst of a current surge, and we encouraged her to consider proceeding with vaccination. She will consider this and let us know if she needs help to coordinate an appointment.  In a visit lasting 60 minutes, greater than 50% of the time was spent face to face reviewing her case, as well as in preparation of, discussing, and coordinating the patient's care.  The above documentation reflects my direct findings during this shared patient visit. Please see the separate note by Dr. Lisbeth Renshaw on this date for the remainder of the  patient's plan of care.    Carola Rhine, PAC

## 2020-02-18 ENCOUNTER — Telehealth: Payer: Self-pay | Admitting: Licensed Clinical Social Worker

## 2020-02-18 NOTE — Telephone Encounter (Signed)
[facility] Clinical Social Work   Attempted to contact patient to introduce support services and follow-up on distress screen.  No answer, no voicemail set-up.  CSW will attempt to contact again later this week.   Christeen Douglas, LCSW

## 2020-02-19 ENCOUNTER — Encounter: Payer: Self-pay | Admitting: Licensed Clinical Social Worker

## 2020-02-19 NOTE — Progress Notes (Signed)
[facility] Psychosocial Distress Screening Clinical Social Work  Clinical Social Work was referred by distress screening protocol.  The patient scored a 8 on the Psychosocial Distress Thermometer which indicates severe distress. Clinical Social Worker contacted patient by phone to assess for distress and other psychosocial needs.   Patient reports feeling much better after her appointment with radiation oncology and getting more information about her stage and treatment plan. She now has a plan of how to talk to her daughter and feels confident in that.  Patient reports strong support from her husband, a close friend who is the medical field, and family.  She is looking into her benefits through work and feels supported there as well. No resource (transportation, food, financial) needs at this time.  ONCBCN DISTRESS SCREENING 02/17/2020  Screening Type Initial Screening  Distress experienced in past week (1-10) 8  Practical problem type Work/school  Family Problem type Children  Emotional problem type Depression;Nervousness/Anxiety;Adjusting to illness  Information Concerns Type Lack of info about diagnosis;Lack of info about treatment  Physical Problem type Sleep/insomnia  Other Contact via phone    Clinical Social Worker follow up needed: No. CSW provided direct contact information so patient may call as needed for any future support needs.  If yes, follow up plan:  Emily Forse E Breea Loncar, LCSW

## 2020-02-20 ENCOUNTER — Ambulatory Visit: Payer: Self-pay | Admitting: Surgery

## 2020-02-20 DIAGNOSIS — C50912 Malignant neoplasm of unspecified site of left female breast: Secondary | ICD-10-CM

## 2020-02-20 NOTE — H&P (View-Only) (Signed)
History of Present Illness Susan Daniel. Tsuei MD; 02/20/2020 12:34 PM) The patient is a 50 year old female who presents with breast cancer. Referred by Dr. Everlene Farrier for left breast cancer Susan Daniel   This is a 50 year old female with the family history of breast cancer her mother who passed away of breast cancer. She presents after recent screening mammogram. This showed a mass in the left breast. She underwent further workup which revealed a 0.8 x 0.5 x 0.8 cm irregular spiculated mass in the left breast at 6:00 2 cm from the nipple. This area was biopsied and revealed diagnosis of invasive ductal carcinoma ER/PR positive, Ki-67 10%, HER-2 negative, Nottingham grade 2. She presents now for initial surgical evaluation. She has already seen radiation therapy and they are planning radiation if she has breast conserving therapy. She is scheduled see genetics as well as medical oncology next week.  This patient smokes at least a half a pack per day. She has a 66 year old daughter. No sisters. Her mother is deceased from complications of breast cancer treatment.   Problem List/Past Medical Rodman Key K. Tsuei, MD; 02/20/2020 12:34 PM) RIGHT GROIN PAIN (R10.31) INVASIVE DUCTAL CARCINOMA OF LEFT BREAST (T05.697)  Past Surgical History ([name], MD; 02/20/2020 12:34 PM) Oral Surgery Tonsillectomy  Diagnostic Studies History [name], CNA; 02/20/2020 11:26 AM) Colonoscopy never Mammogram within last year Pap Smear 1-5 years ago  Allergies Rodman Key K. Tsuei, MD; 02/20/2020 12:34 PM) Sulfacetamide *CHEMICALS* Codeine/Codeine Derivatives  Medication History [name], CNA; 02/20/2020 11:27 AM) Clindamycin HCl (150MG Capsule, Oral) Active. Mucinex D (60-600MG Tablet ER 12HR, Oral) Active. [facility]S Tussin DM (100-10MG/5ML Liquid, Oral) Active. Tylenol Extra Strength (500MG Tablet, Oral) Active. Keflex (500MG Capsule, Oral) Active. Lovenox (40MG/0.4ML  Solution, Subcutaneous) Active. Dilaudid (2MG Tablet, Oral) Active. Robaxin (500MG Tablet, Oral) Active. Medications Reconciled  Social History Susan Daniel. Tsuei, MD; 02/20/2020 12:34 PM) Alcohol use Occasional alcohol use. Caffeine use Coffee, Tea. No drug use Tobacco use Current every day smoker.  Family History Susan Daniel. Tsuei, MD; 02/20/2020 12:34 PM) Alcohol Abuse Father. Anesthetic complications Mother. Arthritis Family Members In General. Breast Cancer Family Members In General, Mother. Depression Family Members In General, Father. Diabetes Mellitus Family Members In General. Heart Disease Family Members In General. Heart disease in female family member before age 7 Hypertension Family Members In General. Ischemic Bowel Disease Family Members In General. Kidney Disease Family Members In General. Migraine Headache Family Members In General. Ovarian Cancer Family Members In General. Rectal Cancer Family Members In General. Respiratory Condition Family Members In General. Thyroid problems Family Members In General.  Pregnancy / Birth History Susan Daniel. Tsuei, MD; 02/20/2020 12:34 PM) Age at menarche 73 years. Contraceptive History Intrauterine device, Oral contraceptives. Gravida 4 Length (months) of breastfeeding 3-6 Maternal age 71-30 31-35 Para 1 Irregular periods  Other Problems Susan Daniel. Tsuei, MD; 02/20/2020 12:34 PM) Breast Cancer Hemorrhoids Lump In Breast Arthritis Gastroesophageal Reflux Disease     Review of Systems [name] CNA; 02/20/2020 11:26 AM) General Not Present- Appetite Loss, Chills, Fatigue, Fever, Night Sweats, Weight Gain and Weight Loss. Skin Not Present- Change in Wart/Mole, Dryness, Hives, Jaundice, New Lesions, Non-Healing Wounds, Rash and Ulcer. HEENT Present- Seasonal Allergies. Not Present- Earache, Hearing Loss, Hoarseness, Nose Bleed, Oral Ulcers, Ringing in the Ears, Sinus Pain, Sore  Throat, Visual Disturbances, Wears glasses/contact lenses and Yellow Eyes. Respiratory Not Present- Bloody sputum, Chronic Cough, Difficulty Breathing, Snoring and Wheezing. Breast Present- Breast Mass. Not Present- Breast Pain, Nipple Discharge  and Skin Changes. Cardiovascular Not Present- Chest Pain, Difficulty Breathing Lying Down, Leg Cramps, Palpitations, Rapid Heart Rate, Shortness of Breath and Swelling of Extremities. Gastrointestinal Not Present- Abdominal Pain, Bloating, Bloody Stool, Change in Bowel Habits, Chronic diarrhea, Constipation, Difficulty Swallowing, Excessive gas, Gets full quickly at meals, Hemorrhoids, Indigestion, Nausea, Rectal Pain and Vomiting. Female Genitourinary Not Present- Frequency, Nocturia, Painful Urination, Pelvic Pain and Urgency. Musculoskeletal Present- Back Pain and Joint Pain. Not Present- Joint Stiffness, Muscle Pain, Muscle Weakness and Swelling of Extremities. Neurological Not Present- Decreased Memory, Fainting, Headaches, Numbness, Seizures, Tingling, Tremor, Trouble walking and Weakness. Psychiatric Not Present- Anxiety, Bipolar, Change in Sleep Pattern, Depression, Fearful and Frequent crying. Endocrine Not Present- Cold Intolerance, Excessive Hunger, Hair Changes, Heat Intolerance, Hot flashes and New Diabetes. Hematology Not Present- Blood Thinners, Easy Bruising, Excessive bleeding, Gland problems, HIV and Persistent Infections.  Vitals ([name] CNA; 02/20/2020 11:28 AM) 02/20/2020 11:28 AM Weight: 145.38 lb Height: 65.5in Body Surface Area: 1.74 m Body Mass Index: 23.82 kg/m  Temp.: 98.2F  Pulse: 115 (Regular)  P.OX: 98% (Room air) BP: 120/80(Sitting, Left Arm, Standard)        Physical Exam ([name] MD; 02/20/2020 12:35 PM)  The physical exam findings are as follows: Note:Constitutional: WDWN in NAD, conversant, no obvious deformities; resting comfortably Eyes: Pupils equal, round; sclera anicteric; moist  conjunctiva; no lid lag HENT: Oral mucosa moist; good dentition Neck: No masses palpated, trachea midline; no thyromegaly Lungs: CTA bilaterally; normal respiratory effort Breasts: Symmetric, no nipple changes, no axillary lymphadenopathy, bilateral fibrocystic changes, no palpable breast mass on either side. CV: Regular rate and rhythm; no murmurs; extremities well-perfused with no edema Abd: +bowel sounds, soft, non-tender, no palpable organomegaly; no palpable hernias Musc: Normal gait; no apparent clubbing or cyanosis in extremities Lymphatic: No palpable cervical or axillary lymphadenopathy Skin: Warm, dry; no sign of jaundice Psychiatric - alert and oriented x 4; calm mood and affect    Assessment & Plan ([name] MD; 02/20/2020 12:35 PM)  INVASIVE DUCTAL CARCINOMA OF LEFT BREAST (C50.912) Impression: Grade 2, 0.8 x 0.5 x 0.8 cm at 0600 2 cmfn Her 2 negative, Er/PR positive Ki67 10%  Current Plans Schedule for Surgery - left radioactive seed localized lumpectomy with sentinel lymph node biopsy and blue dye injection. The surgical procedure has been discussed with the patient. Potential risks, benefits, alternative treatments, and expected outcomes have been explained. All of the patient's questions at this time have been answered. The likelihood of reaching the patient's treatment goal is good. The patient understand the proposed surgical procedure and wishes to proceed. Note:If her genetics comes back positive for one of the major breast cancer genes, we may reconsider surgical plan. At that time we may consider mastectomy plus minus right prophylactic mastectomy. She would not likely be a candidate for immediate reconstruction because she still smokes cigarettes.  [name], MD, FACS [facility]  General/ Trauma Surgery   02/20/2020 12:36 PM   

## 2020-02-20 NOTE — H&P (Signed)
History of Present Illness Susan Daniel. Susan Willcox MD; 02/20/2020 12:34 PM) The patient is a 50 year old female who presents with breast cancer. Referred by Dr. Everlene Farrier for left breast cancer [name] is a 50 year old female with the family history of breast cancer her mother who passed away of breast cancer. She presents after recent screening mammogram. This showed a mass in the left breast. She underwent further workup which revealed a 0.8 x 0.5 x 0.8 cm irregular spiculated mass in the left breast at 6:00 2 cm from the nipple. This area was biopsied and revealed diagnosis of invasive ductal carcinoma ER/PR positive, Ki-67 10%, HER-2 negative, Nottingham grade 2. She presents now for initial surgical evaluation. She has already seen radiation therapy and they are planning radiation if she has breast conserving therapy. She is scheduled see genetics as well as medical oncology next week.  This patient smokes at least a half a pack per day. She has a 66 year old daughter. No sisters. Her mother is deceased from complications of breast cancer treatment.   Problem List/Past Medical [name], MD; 02/20/2020 12:34 PM) RIGHT GROIN PAIN (R10.31) INVASIVE DUCTAL CARCINOMA OF LEFT BREAST (T05.697)  Past Surgical History ([name], MD; 02/20/2020 12:34 PM) Oral Surgery Tonsillectomy  Diagnostic Studies History [name], CNA; 02/20/2020 11:26 AM) Colonoscopy never Mammogram within last year Pap Smear 1-5 years ago  Allergies [name], MD; 02/20/2020 12:34 PM) Sulfacetamide *CHEMICALS* Codeine/Codeine Derivatives  Medication History [name], CNA; 02/20/2020 11:27 AM) Clindamycin HCl (150MG Capsule, Oral) Active. Mucinex D (60-600MG Tablet ER 12HR, Oral) Active. CVS Tussin DM (100-10MG/5ML Liquid, Oral) Active. Tylenol Extra Strength (500MG Tablet, Oral) Active. Keflex (500MG Capsule, Oral) Active. Lovenox (40MG/0.4ML  Solution, Subcutaneous) Active. Dilaudid (2MG Tablet, Oral) Active. Robaxin (500MG Tablet, Oral) Active. Medications Reconciled  Social History Susan Daniel. [name], MD; 02/20/2020 12:34 PM) Alcohol use Occasional alcohol use. Caffeine use Coffee, Tea. No drug use Tobacco use Current every day smoker.  Family History Susan Daniel. [name], MD; 02/20/2020 12:34 PM) Alcohol Abuse Father. Anesthetic complications Mother. Arthritis Family Members In General. Breast Cancer Family Members In General, Mother. Depression Family Members In General, Father. Diabetes Mellitus Family Members In General. Heart Disease Family Members In General. Heart disease in female family member before age 7 Hypertension Family Members In General. Ischemic Bowel Disease Family Members In General. Kidney Disease Family Members In General. Migraine Headache Family Members In General. Ovarian Cancer Family Members In General. Rectal Cancer Family Members In General. Respiratory Condition Family Members In General. Thyroid problems Family Members In General.  Pregnancy / Birth History Susan Daniel. [name], MD; 02/20/2020 12:34 PM) Age at menarche 73 years. Contraceptive History Intrauterine device, Oral contraceptives. Gravida 4 Length (months) of breastfeeding 3-6 Maternal age 71-30 31-35 Para 1 Irregular periods  Other Problems Susan Daniel. [name], MD; 02/20/2020 12:34 PM) Breast Cancer Hemorrhoids Lump In Breast Arthritis Gastroesophageal Reflux Disease     Review of Systems [name] CNA; 02/20/2020 11:26 AM) General Not Present- Appetite Loss, Chills, Fatigue, Fever, Night Sweats, Weight Gain and Weight Loss. Skin Not Present- Change in Wart/Mole, Dryness, Hives, Jaundice, New Lesions, Non-Healing Wounds, Rash and Ulcer. HEENT Present- Seasonal Allergies. Not Present- Earache, Hearing Loss, Hoarseness, Nose Bleed, Oral Ulcers, Ringing in the Ears, Sinus Pain, Sore  Throat, Visual Disturbances, Wears glasses/contact lenses and Yellow Eyes. Respiratory Not Present- Bloody sputum, Chronic Cough, Difficulty Breathing, Snoring and Wheezing. Breast Present- Breast Mass. Not Present- Breast Pain, Nipple Discharge  and Skin Changes. Cardiovascular Not Present- Chest Pain, Difficulty Breathing Lying Down, Leg Cramps, Palpitations, Rapid Heart Rate, Shortness of Breath and Swelling of Extremities. Gastrointestinal Not Present- Abdominal Pain, Bloating, Bloody Stool, Change in Bowel Habits, Chronic diarrhea, Constipation, Difficulty Swallowing, Excessive gas, Gets full quickly at meals, Hemorrhoids, Indigestion, Nausea, Rectal Pain and Vomiting. Female Genitourinary Not Present- Frequency, Nocturia, Painful Urination, Pelvic Pain and Urgency. Musculoskeletal Present- Back Pain and Joint Pain. Not Present- Joint Stiffness, Muscle Pain, Muscle Weakness and Swelling of Extremities. Neurological Not Present- Decreased Memory, Fainting, Headaches, Numbness, Seizures, Tingling, Tremor, Trouble walking and Weakness. Psychiatric Not Present- Anxiety, Bipolar, Change in Sleep Pattern, Depression, Fearful and Frequent crying. Endocrine Not Present- Cold Intolerance, Excessive Hunger, Hair Changes, Heat Intolerance, Hot flashes and New Diabetes. Hematology Not Present- Blood Thinners, Easy Bruising, Excessive bleeding, Gland problems, HIV and Persistent Infections.  Vitals ([name] CNA; 02/20/2020 11:28 AM) 02/20/2020 11:28 AM Weight: 145.38 lb Height: 65.5in Body Surface Area: 1.74 m Body Mass Index: 23.82 kg/m  Temp.: 98.2F  Pulse: 115 (Regular)  P.OX: 98% (Room air) BP: 120/80(Sitting, Left Arm, Standard)        Physical Exam ([name] MD; 02/20/2020 12:35 PM)  The physical exam findings are as follows: Note:Constitutional: WDWN in NAD, conversant, no obvious deformities; resting comfortably Eyes: Pupils equal, round; sclera anicteric; moist  conjunctiva; no lid lag HENT: Oral mucosa moist; good dentition Neck: No masses palpated, trachea midline; no thyromegaly Lungs: CTA bilaterally; normal respiratory effort Breasts: Symmetric, no nipple changes, no axillary lymphadenopathy, bilateral fibrocystic changes, no palpable breast mass on either side. CV: Regular rate and rhythm; no murmurs; extremities well-perfused with no edema Abd: +bowel sounds, soft, non-tender, no palpable organomegaly; no palpable hernias Musc: Normal gait; no apparent clubbing or cyanosis in extremities Lymphatic: No palpable cervical or axillary lymphadenopathy Skin: Warm, dry; no sign of jaundice Psychiatric - alert and oriented x 4; calm mood and affect    Assessment & Plan ([name] MD; 02/20/2020 12:35 PM)  INVASIVE DUCTAL CARCINOMA OF LEFT BREAST (C50.912) Impression: Grade 2, 0.8 x 0.5 x 0.8 cm at 0600 2 cmfn Her 2 negative, Er/PR positive Ki67 10%  Current Plans Schedule for Surgery - left radioactive seed localized lumpectomy with sentinel lymph node biopsy and blue dye injection. The surgical procedure has been discussed with the patient. Potential risks, benefits, alternative treatments, and expected outcomes have been explained. All of the patient's questions at this time have been answered. The likelihood of reaching the patient's treatment goal is good. The patient understand the proposed surgical procedure and wishes to proceed. Note:If her genetics comes back positive for one of the major breast cancer genes, we may reconsider surgical plan. At that time we may consider mastectomy plus minus right prophylactic mastectomy. She would not likely be a candidate for immediate reconstruction because she still smokes cigarettes.  [name], MD, FACS [facility]  General/ Trauma Surgery   02/20/2020 12:36 PM   

## 2020-02-23 ENCOUNTER — Other Ambulatory Visit: Payer: Self-pay | Admitting: Surgery

## 2020-02-23 DIAGNOSIS — C50912 Malignant neoplasm of unspecified site of left female breast: Secondary | ICD-10-CM

## 2020-02-24 ENCOUNTER — Encounter: Payer: Self-pay | Admitting: *Deleted

## 2020-02-24 ENCOUNTER — Inpatient Hospital Stay: Payer: No Typology Code available for payment source

## 2020-02-24 ENCOUNTER — Encounter: Payer: Self-pay | Admitting: Genetic Counselor

## 2020-02-24 ENCOUNTER — Inpatient Hospital Stay: Payer: No Typology Code available for payment source | Attending: Oncology | Admitting: Genetic Counselor

## 2020-02-24 ENCOUNTER — Other Ambulatory Visit: Payer: Self-pay | Admitting: Genetic Counselor

## 2020-02-24 ENCOUNTER — Other Ambulatory Visit: Payer: Self-pay

## 2020-02-24 DIAGNOSIS — Z803 Family history of malignant neoplasm of breast: Secondary | ICD-10-CM | POA: Insufficient documentation

## 2020-02-24 DIAGNOSIS — C50512 Malignant neoplasm of lower-outer quadrant of left female breast: Secondary | ICD-10-CM

## 2020-02-24 DIAGNOSIS — Z800 Family history of malignant neoplasm of digestive organs: Secondary | ICD-10-CM | POA: Diagnosis not present

## 2020-02-24 DIAGNOSIS — Z170 Estrogen receptor positive status [ER+]: Secondary | ICD-10-CM | POA: Insufficient documentation

## 2020-02-24 DIAGNOSIS — F17200 Nicotine dependence, unspecified, uncomplicated: Secondary | ICD-10-CM | POA: Insufficient documentation

## 2020-02-24 LAB — GENETIC SCREENING ORDER

## 2020-02-24 NOTE — Progress Notes (Signed)
REFERRING PROVIDER: Hayden Pedro, PA-C [place],  [place] [address]  PRIMARY PROVIDER:  Wendie Agreste, MD  PRIMARY REASON FOR VISIT:  1. Malignant neoplasm of lower-outer quadrant of left breast of female, estrogen receptor positive ([place])   2. Family history of breast cancer   3. Family history of rectal cancer       HISTORY OF PRESENT ILLNESS:   [name], a 50 y.o. female, was seen for a [place] cancer genetics consultation at the request of Shona Cayton, PA-C,due to a personal and family history of cancer. [name] presents to clinic today to discuss the possibility of a hereditary predisposition to cancer, genetic testing, and to further clarify her future cancer risks, as well as potential cancer risks for family members.   In January of 2022, at the age of 50, [name] was diagnosed with invasive ductal carcinoma of the left breast. The tumor is ER+/PR+/Her2-. The treatment plan includes surgery (lumpectomy scheduled 03/11/20).   RISK FACTORS:  Menarche was at age 58-14.  First live birth at age 52.  OCP use for approximately 10-15 years.  Ovaries intact: yes.  Hysterectomy: no.  Menopausal status: premenopausal.  HRT use: 0 years. Colonoscopy: no; never. Mammogram within the last year: yes. Number of breast biopsies: 1. Any excessive radiation exposure in the past: no.   Past Medical History:  Diagnosis Date  . Allergy   . Arthritis   . Breast cancer ([place]) 02/10/2020   Left  . Family history of breast cancer   . Family history of rectal cancer     Past Surgical History:  Procedure Laterality Date  . BREAST BIOPSY Left 02/10/2020    Social History   Socioeconomic History  . Marital status: Married    Spouse name: Not on file  . Number of children: 1  . Years of education: Not on file  . Highest education level: Not on file  Occupational History  . Occupation: Control and instrumentation engineer: [employer]   Tobacco Use  . Smoking status: Current Every Day Smoker  . Smokeless tobacco: Never Used  Substance and Sexual Activity  . Alcohol use: Yes    Comment: ocassionally  . Drug use: No  . Sexual activity: Not on file  Other Topics Concern  . Not on file  Social History Narrative  . Not on file   Social Determinants of Health   Financial Resource Strain: Not on file  Food Insecurity: Not on file  Transportation Needs: Not on file  Physical Activity: Not on file  Stress: Not on file  Social Connections: Not on file     FAMILY HISTORY:  We obtained a detailed, 4-generation family history.  Significant diagnoses are listed below: Family History  Problem Relation Age of Onset  . Heart disease Paternal Grandfather   . Aneurysm Paternal Grandfather   . Diabetes Maternal Grandmother   . Heart disease Maternal Grandmother   . Breast cancer Mother 44       second diagnosis at age 1  . Kidney disease Maternal Grandfather   . Irritable bowel syndrome Other        Cousins  . Kidney disease Maternal Uncle   . Breast cancer Cousin        dx late 30s/40s; Maternal 1st cousin, daughter of pt's mother's brother  . Rectal cancer Maternal Aunt        dx late 27s/40s   [name] has one daughter (  age 51). [name] has no siblings.  [name]'s mother died at age 2 and had a history of breast cancer twice (first diagnosed at age 51, then again at age 45). There is one maternal aunt and there were six maternal uncles. Her aunt was diagnosed with rectal cancer in her 32s or 25s. One maternal cousin was diagnosed with breast cancer in her late 79s or 28s. [name] maternal grandmother died at age 85 without cancer. Her maternal grandfather died at age 76 without cancer.  [name]'s father died at age 85 without cancer. There are three paternal aunts and two paternal uncles. There is no known cancer among paternal aunts/uncles or paternal cousins. [name]'s paternal grandmother  died at age 59 without cancer. Her paternal grandfather died at age 59 without cancer.   [name] is unaware of previous family history of genetic testing for hereditary cancer risks. Patient's ancestors are of unknown descent. There is no reported Ashkenazi Jewish ancestry. There is no known consanguinity.  GENETIC COUNSELING ASSESSMENT: [name] is a 50 y.o. female with a personal and family history of breast cancer and a family history of young onset rectal cancer, which is somewhat suggestive of a hereditary cancer syndrome and predisposition to cancer. We, therefore, discussed and recommended the following at today's visit.   DISCUSSION: We discussed that approximately 5-10% of breast cancer is hereditary,  meaning that it is due to a mutation in a single gene that is passed down from generation to generation in a family. Most hereditary cases of breast cancer are associated with the BRCA1 and BRCA2 genes. There are other genes that can be associated an increased risk for breast cancer, including ATM, CHEK2, PALB2, etc. We discussed that testing can beneficial for several reasons, including knowing about other cancer risks, identifying potential screening and risk-reduction options that may be appropriate, and to understand if other family members could be at risk for cancer and allow them to undergo genetic testing.   In particular, we discussed the potential consideration for bilateral mastectomies if certain gene mutations are detected. If a high-penetrance gene mutation is identified, some patients will opt to have this larger surgery to reduce their risk to develop a second breast cancer in the future. Others opt to continue with a lumpectomy and will pursue high-risk breast cancer screening to manage their breast cancer risk. [name] is not sure what her decision would be in such a situation, but feels that genetic testing would provide important information for her and her family  members.   We reviewed the characteristics, features and inheritance patterns of hereditary cancer syndromes. We also discussed genetic testing, including the appropriate family members to test, the process of testing, insurance coverage and turn-around-time for results. We discussed the implications of a negative, positive and/or variant of uncertain significant result. In order to get genetic test results in a timely manner so that Ms. Amalfitano can use these genetic test results for surgical decisions, we recommended Ms. Cutting pursue genetic testing for the [employer]. Once complete, we recommend Ms. Dill pursue reflex genetic testing to the CustomNext-Cancer + RNAinsight gene panel.   The BRCAplus panel offered by [employer] and includes sequencing and deletion/duplication analysis for the following 8 genes: ATM, BRCA1, BRCA2, CDH1, CHEK2, PALB2, PTEN, and TP53. The [facility]Next-Cancer+RNAinsight panel offered by [facility] includes sequencing and rearrangement analysis for the following 47 genes:  APC, ATM, AXIN2, BARD1, BMPR1A, BRCA1, BRCA2, BRIP1, CDH1, CDK4, CDKN2A, CHEK2,  DICER1, EPCAM, GREM1, HOXB13, MEN1, MLH1, MSH2, MSH3, MSH6, MUTYH, NBN, NF1, NF2, NTHL1, PALB2, PMS2, POLD1, POLE, PTEN, RAD51C, RAD51D, RECQL, RET, SDHA, SDHAF2, SDHB, SDHC, SDHD, SMAD4, SMARCA4, STK11, TP53, TSC1, TSC2, and VHL.  RNA data is routinely analyzed for use in variant interpretation for all genes.  Based on Ms. Braley's personal and family history of cancer, she meets medical criteria for genetic testing. Despite that she meets criteria, she may still have an out of pocket cost.   PLAN: After considering the risks, benefits, and limitations, Ms. Mccook provided informed consent to pursue genetic testing and the blood sample was sent to [facility] for analysis of the [employer] and [facility]Next-Cancer + RNAinsight panel. Results should be available within approximately one-two  weeks' time, at which point they will be disclosed by telephone to Ms. Arreguin, as will any additional recommendations warranted by these results. Ms. Gangwer will receive a summary of her genetic counseling visit and a copy of her results once available. This information will also be available in Epic.   Ms. Peyser questions were answered to her satisfaction today. Our contact information was provided should additional questions or concerns arise. Thank you for the referral and allowing [place] to share in the care of your patient.   Clint Guy, [place], [facility] Licensed, Certified Dispensing [email or web address] Phone: [phone]  The patient was seen for a total of 60 minutes in face-to-face genetic counseling.  This patient was discussed with Drs. Magrinat, Lindi Adie and/or Burr Medico who agrees with the above.    _______________________________________________________________________ For Office Staff:  Number of people involved in session: 1 Was an Intern/ student involved with case: no

## 2020-02-25 NOTE — Progress Notes (Signed)
[place]  [phone] [phone]     ID: Susan Susan DOB: 1970/12/14  MR#: [id number]  KTG#:[id number]  Patient Care Team: Wendie Agreste, MD as PCP - General (Family Medicine) Mauro Kaufmann, RN as Oncology Nurse Navigator Rockwell Germany, RN as Oncology Nurse Navigator Everlene Farrier, MD as Consulting Physician (Obstetrics and Gynecology) Donnie Mesa, MD as Consulting Physician (General Surgery) Kyung Rudd, MD as Consulting Physician (Radiation Oncology) Cantrell Martus, Virgie Dad, MD as Consulting Physician (Oncology) Chauncey Cruel, MD OTHER MD:  CHIEF COMPLAINT: estrogen receptor positive breast cancer  CURRENT TREATMENT: Definitive surgery pending   HISTORY OF CURRENT ILLNESS: Susan Susan had routine screening mammography on showing a possible abnormality in the left breast. Susan Susan underwent left diagnostic mammography with tomography and left breast ultrasonography at [facility] on 02/09/2020 showing: breast density category C; 0.8 cm spiculated mass in left breast at 6 o'clock; 1.4 cm hypoechoic mass in left breast at 5:30, unchanged compared to prior ultrasound in 08/2017; ultrasound of left axilla is negative.  Accordingly on 02/10/2020 Susan Susan proceeded to biopsy of the left breast area in question. 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 Prognostic indicators significant for: estrogen receptor, 80% positive with moderate-strong staining intensity and progesterone receptor, 100% positive with strong staining intensity. Proliferation marker Ki67 at 10%. HER2 negative by immunohistochemistry (1+).  Cancer Staging Malignant neoplasm of lower-outer quadrant of left breast of female, estrogen receptor positive ([place]) Staging form: Breast, AJCC 8th Edition - Clinical stage from 02/17/2020: Stage IA (cT1b, cN0, cM0, G2, ER+,  PR+, HER2+) - Signed by Chauncey Cruel, MD on 02/26/2020 Stage prefix: Initial diagnosis Method of lymph node assessment: Clinical   [name] subsequent history is as detailed below.   INTERVAL HISTORY: Susan Susan was evaluated in the breast cancer clinic on 02/26/2020. Susan Susan case was also presented at the multidisciplinary breast cancer conference on 02/18/2020. At that time a preliminary plan was proposed: Breast conserving surgery with sentinel lymph node sampling, Oncotype, adjuvant radiation, antiestrogens  Susan Susan underwent genetic counseling on 02/24/2020 given Susan Susan strong family history of cancer. The results are pending.   Susan Susan is scheduled for left lumpectomy on 03/11/2020 under Dr. Georgette Dover.   REVIEW OF SYSTEMS: There were no specific symptoms leading to the original mammogram, which was routinely scheduled. The patient denies unusual headaches, visual changes, nausea, vomiting, stiff neck, dizziness, or gait imbalance. There has been no cough, phlegm production, or pleurisy, no chest pain or pressure, and no change in bowel or bladder habits. The patient denies fever, rash, bleeding, unexplained fatigue or unexplained weight loss. A detailed review of systems was otherwise entirely negative.   COVID 19 VACCINATION STATUS: Never vaccinated; may have had Covid in January 2020 but not tested   PAST MEDICAL HISTORY: Past Medical History:  Diagnosis Date   Allergy    Arthritis    Breast cancer ([place]) 02/10/2020   Left   Family history of breast cancer    Family history of rectal cancer     PAST SURGICAL HISTORY: Past Surgical History:  Procedure Laterality Date   BREAST BIOPSY Left 02/10/2020    FAMILY HISTORY: Family History  Problem Relation Age of Onset   Heart disease Paternal Grandfather    Aneurysm Paternal Grandfather    Diabetes Maternal Grandmother    Heart disease Maternal Grandmother    Breast cancer Daniel 19  second diagnosis at age 75   Kidney  disease Maternal Grandfather    Irritable bowel syndrome Other        Cousins   Kidney disease Maternal Uncle    Breast cancer Cousin        dx late 30s/40s; Maternal 1st cousin, daughter of ptDaniel motherDaniel Susan   Rectal cancer Maternal Aunt        dx late 30s/40s   Susan Susan father died at age 86 shortly after [name] Daniel died at age 37 from from what may have been complications of an autologous transplant at [facility] for Susan Susan breast cancer.. Susan Susan had a history of breast cancer twice, first at age 7 then again at age 10. Susan Susan is an only child. For full family information, see genetic counseling note from 02/24/2020.   GYNECOLOGIC HISTORY:  No LMP recorded. (Menstrual status: IUD). Menarche: 40 or 50 years old Age at first live birth: 50 years old, after multiple miscarriages GX P 1 LMP Mirena in place HRT no  Hysterectomy? no BSO? 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  Susan Susan husband Susan Susan is an Chief Financial Officer.  Their daughter Susan Susan is 72.  The patient in addition has a blue healer and a [place] bulldog at home.  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, forensic    ADVANCED DIRECTIVES: In the absence of any documents to the contrary [name] husband is Susan Susan healthcare power of attorney   HEALTH MAINTENANCE: Social History   Tobacco Use   Smoking status: Current Every Day Smoker   Smokeless tobacco: Never Used  Substance Use Topics   Alcohol use: Yes    Comment: ocassionally   Drug use: No     Colonoscopy: n/a  PAP: Up-to-date  Bone density: Never   Allergies  Allergen Reactions   Codeine    Hydrocodone    Sulfa Antibiotics     Current Outpatient Medications  Medication Sig Dispense Refill   ibuprofen (ADVIL,MOTRIN) 200 MG tablet Take 200 mg by mouth every 6 (six) hours as needed.     levonorgestrel (MIRENA) 20 MCG/24HR IUD 1 each by Intrauterine route once.     triamcinolone (KENALOG) 0.1 % triamcinolone acetonide 0.1 % topical cream  1 APPLICATION APPLY  ON THE SKIN TWICE A DAY AS NEEDED 2 WEEKS ON/OFF (Patient not taking: Reported on 02/26/2020)     No current facility-administered medications for this visit.    OBJECTIVE: White woman who appears stated age  50:   02/26/20 1616  BP: (!) 108/57  Pulse: 89  Resp: 18  Temp: 98.1 F (36.7 C)  SpO2: 98%     Body mass index is 24.34 kg/m.   Wt Readings from Last 3 Encounters:  02/26/20 148 lb 8 oz (67.4 kg)  02/17/20 145 lb 4 oz (65.9 kg)  11/13/17 142 lb (64.4 kg)      ECOG FS:1 - Symptomatic but completely ambulatory  Ocular: Sclerae unicteric, pupils round and equal Ear-nose-throat: Wearing a mask Lymphatic: No cervical or supraclavicular adenopathy Lungs no rales or rhonchi Heart regular rate and rhythm Abd soft, nontender, positive bowel sounds MSK no focal spinal tenderness, no joint edema Neuro: non-focal, well-oriented, appropriate affect Breasts: The right breast is unremarkable.  The left breast is status post recent biopsy.  There is no palpable mass.  There are no skin or nipple changes of concern.  Both axillae are benign.   LAB RESULTS:  CMP     Component Value Date/Time   NA  141 02/26/2020 1547   K 4.3 02/26/2020 1547   CL 108 02/26/2020 1547   CO2 26 02/26/2020 1547   GLUCOSE 88 02/26/2020 1547   BUN 15 02/26/2020 1547   CREATININE 0.92 02/26/2020 1547   CREATININE 0.73 10/25/2012 1220   CALCIUM 9.0 02/26/2020 1547   PROT 6.4 (L) 02/26/2020 1547   ALBUMIN 3.8 02/26/2020 1547   AST 14 (L) 02/26/2020 1547   ALT 15 02/26/2020 1547   ALKPHOS 66 02/26/2020 1547   BILITOT 0.2 (L) 02/26/2020 1547   GFRNONAA >60 02/26/2020 1547   GFRAA  12/05/2007 0529    >60        The eGFR has been calculated using the MDRD equation. This calculation has not been validated in all clinical    No results found for: TOTALPROTELP, ALBUMINELP, A1GS, A2GS, BETS, BETA2SER, GAMS, MSPIKE, SPEI  Lab Results  Component Value Date   WBC 6.8 02/26/2020   NEUTROABS 4.0  02/26/2020   HGB 14.0 02/26/2020   HCT 42.6 02/26/2020   MCV 94.7 02/26/2020   PLT 254 02/26/2020    No results found for: LABCA2  No components found for: FIEPPI951  No results for input(s): INR in the last 168 hours.  No results found for: LABCA2  No results found for: OAC166  No results found for: AYT016  No results found for: WFU932  No results found for: CA2729  No components found for: HGQUANT  No results found for: CEA1 / No results found for: CEA1   No results found for: AFPTUMOR  No results found for: CHROMOGRNA  No results found for: KPAFRELGTCHN, LAMBDASER, KAPLAMBRATIO (kappa/lambda light chains)  No results found for: HGBA, HGBA2QUANT, HGBFQUANT, HGBSQUAN (Hemoglobinopathy evaluation)   Lab Results  Component Value Date   LDH 120 12/05/2007    No results found for: IRON, TIBC, IRONPCTSAT (Iron and TIBC)  No results found for: FERRITIN  Urinalysis No results found for: COLORURINE, APPEARANCEUR, LABSPEC, PHURINE, GLUCOSEU, HGBUR, BILIRUBINUR, KETONESUR, PROTEINUR, UROBILINOGEN, NITRITE, LEUKOCYTESUR   STUDIES: US BREAST LTD UNI LEFT INC AXILLA  Addendum Date: 02/11/2020   ADDENDUM REPORT: 02/11/2020 09:30 ADDENDUM: Ultrasound of the left axilla is negative. Electronically Signed   By: Abelardo Diesel M.D.   On: 02/11/2020 09:30   Result Date: 02/11/2020 CLINICAL DATA:  Callback from screening mammogram for possible mass with distortion left breast EXAM: DIGITAL DIAGNOSTIC LEFT MAMMOGRAM WITH CAD AND TOMOSYNTHESIS ULTRASOUND left BREAST TECHNIQUE: Left digital diagnostic mammography and breast tomosynthesis was performed. Digital images of the left breast were evaluated with computer-aided detection. Targeted ultrasound examination of the left breast was performed. COMPARISON:  Prior films ACR Breast Density Category c: The breast tissue is heterogeneously dense, which may obscure small masses. FINDINGS: Lateral view of left breast, spot compression cc  and lateral views of the left breast are submitted. Previously questioned mass with spiculation persists on additional views in the lower left breast. An adjacent oval mass is identified. Targeted ultrasound is performed, showing 0.8 x 0 5 x 0.8 cm irregular spiculated mass at the left breast 6 o'clock 2 cm from nipple correlating to the callback mass with spiculation. At the left breast 5:30 o'clock 3 cm from nipple, there is hypoechoic oval mass measuring 1.4 x 0.8 x 0.6 cm. This is unchanged compared to prior ultrasound August 2019. IMPRESSION: Highly suspicious findings. RECOMMENDATION: Ultrasound-guided core biopsy of spiculated mass at left breast 6 o'clock position. Consider biopsy of the left breast 5:30 o'clock lesion to establish tissue diagnosis prior to  surgical excision for the left breast 6 o'clock lesion I have discussed the findings and recommendations with the patient. If applicable, a reminder letter will be sent to the patient regarding the next appointment. BI-RADS CATEGORY  5: Highly suggestive of malignancy. Electronically Signed: By: Abelardo Diesel M.D. On: 02/09/2020 13:08   MM DIAG BREAST TOMO UNI LEFT  Addendum Date: 02/11/2020   ADDENDUM REPORT: 02/11/2020 09:30 ADDENDUM: Ultrasound of the left axilla is negative. Electronically Signed   By: Abelardo Diesel M.D.   On: 02/11/2020 09:30   Result Date: 02/11/2020 CLINICAL DATA:  Callback from screening mammogram for possible mass with distortion left breast EXAM: DIGITAL DIAGNOSTIC LEFT MAMMOGRAM WITH CAD AND TOMOSYNTHESIS ULTRASOUND left BREAST TECHNIQUE: Left digital diagnostic mammography and breast tomosynthesis was performed. Digital images of the left breast were evaluated with computer-aided detection. Targeted ultrasound examination of the left breast was performed. COMPARISON:  Prior films ACR Breast Density Category c: The breast tissue is heterogeneously dense, which may obscure small masses. FINDINGS: Lateral view of left  breast, spot compression cc and lateral views of the left breast are submitted. Previously questioned mass with spiculation persists on additional views in the lower left breast. An adjacent oval mass is identified. Targeted ultrasound is performed, showing 0.8 x 0 5 x 0.8 cm irregular spiculated mass at the left breast 6 o'clock 2 cm from nipple correlating to the callback mass with spiculation. At the left breast 5:30 o'clock 3 cm from nipple, there is hypoechoic oval mass measuring 1.4 x 0.8 x 0.6 cm. This is unchanged compared to prior ultrasound August 2019. IMPRESSION: Highly suspicious findings. RECOMMENDATION: Ultrasound-guided core biopsy of spiculated mass at left breast 6 o'clock position. Consider biopsy of the left breast 5:30 o'clock lesion to establish tissue diagnosis prior to surgical excision for the left breast 6 o'clock lesion I have discussed the findings and recommendations with the patient. If applicable, a reminder letter will be sent to the patient regarding the next appointment. BI-RADS CATEGORY  5: Highly suggestive of malignancy. Electronically Signed: By: Abelardo Diesel M.D. On: 02/09/2020 13:08   MM CLIP PLACEMENT LEFT  Result Date: 02/10/2020 CLINICAL DATA:  Evaluate RIBBON biopsy clip placement following ultrasound-guided LEFT breast biopsy. EXAM: DIAGNOSTIC LEFT MAMMOGRAM POST ULTRASOUND BIOPSY COMPARISON:  Previous exam(s). FINDINGS: Mammographic images were obtained following ultrasound guided biopsy of the 0.8 cm mass at the 6 o'clock position of the LEFT breast. The RIBBON biopsy marking clip is in expected position at the site of biopsy. IMPRESSION: Appropriate positioning of the RIBBON shaped biopsy marking clip at the site of biopsy in the LOWER LEFT breast. Final Assessment: Post Procedure Mammograms for Marker Placement Electronically Signed   By: Margarette Canada M.D.   On: 02/10/2020 12:13   [place] LT BREAST BX W LOC DEV 1ST LESION IMG BX SPEC US GUIDE  Addendum Date:  02/11/2020   ADDENDUM REPORT: 02/11/2020 13:48 ADDENDUM: Pathology revealed GRADE II INVASIVE MAMMARY CARCINOMA WITH LOBULAR FEATURES AND CALCIFICATIONS of the LEFT breast, 6 o'clock. This was found to be concordant by Dr. Hassan Rowan. Pathology results were discussed with the patient by telephone. The patient reported doing well after the biopsy with tenderness at the site. Post biopsy instructions and care were reviewed and questions were answered. The patient was encouraged to call [facility] for any additional concerns. The patient was referred to [facility] on February 18, 2020. Pathology results  reported by Stacie Acres RN on 02/11/2020. Electronically Signed   By: Margarette Canada M.D.   On: 02/11/2020 13:48   Result Date: 02/11/2020 CLINICAL DATA:  50 year old female for tissue sampling of 0.8 cm LOWER LEFT breast mass at the 6 o'clock position. On the 02/09/2020 diagnostic mammogram/ultrasound, a 1.4 cm mass was identified at the 5:30 position of the LEFT breast with consideration of sampling. However, given stability sonographically from 08/30/2017 and mammographically from 2012, this is a benign mass and sampling will not be performed. EXAM: ULTRASOUND GUIDED LEFT BREAST CORE NEEDLE BIOPSY COMPARISON:  Previous exam(s). PROCEDURE: I met with the patient and we discussed the procedure of ultrasound-guided biopsy, including benefits and alternatives. We discussed the high likelihood of a successful procedure. We discussed the risks of the procedure, including infection, bleeding, tissue injury, clip migration, and inadequate sampling. Informed written consent was given. The usual time-out protocol was performed immediately prior to the procedure. Using sterile technique and 1% Lidocaine as local anesthetic, under direct ultrasound visualization, a 12 gauge spring-loaded device was used to perform biopsy of 0.8 cm  mass at the 6 o'clock position of the LEFT breast 2 cm from the nipple using a MEDIAL approach. At the conclusion of the procedure a RIBBON tissue marker clip was deployed into the biopsy cavity. Follow up 2 view mammogram was performed and dictated separately. IMPRESSION: Ultrasound guided biopsy of 0.8 cm LOWER LEFT breast mass. No apparent complications. 1.4 cm LEFT breast mass identified at the 5:30 position on the 02/09/2020 diagnostic mammogram/ultrasound was not sampled today as this is a benign mass given stability sonographically and mammographically as discussed above. Electronically Signed: By: Margarette Canada M.D. On: 02/10/2020 12:14     ELIGIBLE FOR AVAILABLE RESEARCH PROTOCOL: No  ASSESSMENT: 50 y.o. [place] woman status post left breast lower outer quadrant biopsy 02/10/2020 for a clinical T1b N0, stage IA invasive ductal carcinoma, grade 2, estrogen and progesterone receptor positive, Susan Susan-2 not amplified, with an MIB-1 of 10%.  (1) genetics testing in process  (2) definitive surgery pending  (3) Oncotype to be obtained from the definitive surgical sample: Chemotherapy not anticipated  (4) adjuvant radiation  This is 5) antiestrogen  PLAN: I met today with Susan Susan to review Susan Susan new diagnosis. Specifically we discussed the biology of Susan Susan breast cancer, its diagnosis, staging, treatment  options and prognosis. We first reviewed the fact that cancer is not one disease but more than 100 different diseases and that it is important to keep them separate-- otherwise when friends and relatives discuss their own cancer experiences with Susan Susan confusion can result. Similarly we explained that if breast cancer spreads to the bone or liver, the patient would not have bone cancer or liver cancer, but breast cancer in the bone and breast cancer in the liver: one cancer in three places-- not 3 different cancers which otherwise would have to be treated in 3 different ways.  We discussed the  difference between local and systemic therapy. In terms of loco-regional treatment, lumpectomy plus radiation is equivalent to mastectomy as far as survival is concerned. For this reason, and because the cosmetic results are generally superior, we recommend breast conserving surgery.  We then discussed the rationale for systemic therapy. There is some risk that this cancer may have already spread to other parts of Susan Susan body. Patients frequently ask at this point about bone scans, CAT scans and PET scans to find out if they have occult breast cancer somewhere else. The problem is  that in early stage disease we are much more likely to find false positives then true cancers and this would expose the patient to unnecessary procedures as well as unnecessary radiation. Scans cannot answer the question the patient really would like to know, which is whether Susan Susan has microscopic disease elsewhere in Susan Susan body. For those reasons we do not recommend them.  Of course we would proceed to aggressive evaluation of any symptoms that might suggest metastatic disease, but that is not the case here.  Next we went over the options for systemic therapy which are anti-estrogens, anti-Susan Susan-2 immunotherapy, and chemotherapy. Susan Susan does not meet criteria for anti-Susan Susan-2 immunotherapy. Susan Susan is a good candidate for anti-estrogens.  The question of chemotherapy is more complicated. Chemotherapy is most effective in rapidly growing, aggressive tumors. It is much less effective in not high-grade, slow growing cancers, like Susan Susan. For that reason we are going to request an Oncotype from the definitive surgical sample, as suggested by NCCN guidelines.  Once we get that result we will know whether or not Susan Susan will benefit from chemotherapy  Susan Susan does qualify for genetics testing. In patients who carry a deleterious mutation [for example in a  BRCA gene], the risk of a new breast cancer developing in the future may be sufficiently great  that the patient may choose bilateral mastectomies. However if Susan Susan wishes to keep Susan Susan breasts in that situation it is safe to do so. That would require intensified screening, which generally means not only yearly mammography but a yearly breast MRI as well.  Susan Susan tells me in Susan Susan case Susan Susan would not want bilateral mastectomies even if Susan Susan carries a deleterious mutation, because Susan Susan would not be able to undergo reconstruction (Susan Susan is still smoking.  Susan Susan is interested in participating in intensified screening.  Plan then is for surgery, Oncotype, likely no chemo, adjuvant radiation, and tamoxifen.  Susan Susan has a good understanding of the overall plan. Susan Susan agrees with it. Susan Susan knows the goal of treatment in Susan Susan case is cure. Susan Susan will call with any problems that may develop before Susan Susan next visit here.   Virgie Dad. Susan Mabey, MD 02/26/2020 5:44 PM Medical Oncology and Hematology [facility] [place], [place] [address] Tel. [phone]    Fax. [phone]   This document serves as a record of services personally performed by Lurline Del, MD. It was created on his behalf by Wilburn Mylar, a trained medical scribe. The creation of this record is based on the scribeDaniel personal observations and the providerDaniel statements to them.   I, Lurline Del MD, have reviewed the above documentation for accuracy and completeness, and I agree with the above.    *Total Encounter Time as defined by the Centers for Medicare and Medicaid Services includes, in addition to the face-to-face time of a patient visit (documented in the note above) non-face-to-face time: obtaining and reviewing outside history, ordering and reviewing medications, tests or procedures, care coordination (communications with other health care professionals or caregivers) and documentation in the medical record.

## 2020-02-26 ENCOUNTER — Inpatient Hospital Stay: Payer: No Typology Code available for payment source

## 2020-02-26 ENCOUNTER — Other Ambulatory Visit: Payer: Self-pay

## 2020-02-26 ENCOUNTER — Inpatient Hospital Stay (HOSPITAL_BASED_OUTPATIENT_CLINIC_OR_DEPARTMENT_OTHER): Payer: No Typology Code available for payment source | Admitting: Oncology

## 2020-02-26 VITALS — BP 108/57 | HR 89 | Temp 98.1°F | Resp 18 | Ht 65.5 in | Wt 148.5 lb

## 2020-02-26 DIAGNOSIS — Z800 Family history of malignant neoplasm of digestive organs: Secondary | ICD-10-CM | POA: Diagnosis not present

## 2020-02-26 DIAGNOSIS — Z170 Estrogen receptor positive status [ER+]: Secondary | ICD-10-CM | POA: Diagnosis not present

## 2020-02-26 DIAGNOSIS — Z803 Family history of malignant neoplasm of breast: Secondary | ICD-10-CM | POA: Diagnosis not present

## 2020-02-26 DIAGNOSIS — C50512 Malignant neoplasm of lower-outer quadrant of left female breast: Secondary | ICD-10-CM

## 2020-02-26 DIAGNOSIS — Z720 Tobacco use: Secondary | ICD-10-CM

## 2020-02-26 DIAGNOSIS — F17200 Nicotine dependence, unspecified, uncomplicated: Secondary | ICD-10-CM | POA: Diagnosis not present

## 2020-02-26 LAB — CBC WITH DIFFERENTIAL (CANCER CENTER ONLY)
Abs Immature Granulocytes: 0.02 10*3/uL (ref 0.00–0.07)
Basophils Absolute: 0.1 10*3/uL (ref 0.0–0.1)
Basophils Relative: 1 %
Eosinophils Absolute: 0.1 10*3/uL (ref 0.0–0.5)
Eosinophils Relative: 1 %
HCT: 42.6 % (ref 36.0–46.0)
Hemoglobin: 14 g/dL (ref 12.0–15.0)
Immature Granulocytes: 0 %
Lymphocytes Relative: 33 %
Lymphs Abs: 2.2 10*3/uL (ref 0.7–4.0)
MCH: 31.1 pg (ref 26.0–34.0)
MCHC: 32.9 g/dL (ref 30.0–36.0)
MCV: 94.7 fL (ref 80.0–100.0)
Monocytes Absolute: 0.4 10*3/uL (ref 0.1–1.0)
Monocytes Relative: 6 %
Neutro Abs: 4 10*3/uL (ref 1.7–7.7)
Neutrophils Relative %: 59 %
Platelet Count: 254 10*3/uL (ref 150–400)
RBC: 4.5 MIL/uL (ref 3.87–5.11)
RDW: 11.8 % (ref 11.5–15.5)
WBC Count: 6.8 10*3/uL (ref 4.0–10.5)
nRBC: 0 % (ref 0.0–0.2)

## 2020-02-26 LAB — CMP (CANCER CENTER ONLY)
ALT: 15 U/L (ref 0–44)
AST: 14 U/L — ABNORMAL LOW (ref 15–41)
Albumin: 3.8 g/dL (ref 3.5–5.0)
Alkaline Phosphatase: 66 U/L (ref 38–126)
Anion gap: 7 (ref 5–15)
BUN: 15 mg/dL (ref 6–20)
CO2: 26 mmol/L (ref 22–32)
Calcium: 9 mg/dL (ref 8.9–10.3)
Chloride: 108 mmol/L (ref 98–111)
Creatinine: 0.92 mg/dL (ref 0.44–1.00)
GFR, Estimated: >60 mL/min (ref >60)
Glucose, Bld: 88 mg/dL (ref 70–99)
Potassium: 4.3 mmol/L (ref 3.5–5.1)
Sodium: 141 mmol/L (ref 135–145)
Total Bilirubin: 0.2 mg/dL — ABNORMAL LOW (ref 0.3–1.2)
Total Protein: 6.4 g/dL — ABNORMAL LOW (ref 6.5–8.1)

## 2020-02-27 ENCOUNTER — Telehealth: Payer: Self-pay | Admitting: Oncology

## 2020-02-27 ENCOUNTER — Encounter: Payer: Self-pay | Admitting: *Deleted

## 2020-02-27 NOTE — Telephone Encounter (Signed)
Scheduled appts per 2/10 los. Pt confirmed appt date and time.

## 2020-03-04 ENCOUNTER — Encounter: Payer: Self-pay | Admitting: Genetic Counselor

## 2020-03-04 ENCOUNTER — Telehealth: Payer: Self-pay | Admitting: Genetic Counselor

## 2020-03-04 DIAGNOSIS — Z1379 Encounter for other screening for genetic and chromosomal anomalies: Secondary | ICD-10-CM | POA: Insufficient documentation

## 2020-03-04 NOTE — Telephone Encounter (Signed)
Ms. Kolarik called with concerns about a text from the genetic testing laboratory [facility]) saying that her out of pocket cost would be more than $100 for testing. Discussed that this is contradictory to [facility]'s policy that individuals with breast cancer will not pay more than $100 for testing. We will reach out to [facility] to verify the estimated OOP for this patient and will update her when we hear back.   Also revealed negative genetic testing through the [employer]. Discussed that we do not know why she has breast cancer or why there is cancer in the family. There could be a genetic mutation in the family that Ms. Koelzer did not inherit. There could also be a mutation in a different gene that we are not testing, or our current technology may not be able to detect certain mutations. It will therefore be important for her to stay in contact with genetics to keep up with whether additional testing may be appropriate in the future.   Additional genetic testing through the [facility] panel is currently pending.

## 2020-03-05 NOTE — Progress Notes (Signed)
Surgical Instructions    Your procedure is scheduled on 03/11/20.  Report to [facility] Main Entrance "A" at 08:30 A.M., then check in with the Admitting office.  Call this number if you have problems the morning of surgery:  [phone]   If you have any questions prior to your surgery date call [phone]: Open Monday-Friday 8am-4pm    Remember:  Do not eat after midnight the night before your surgery  You may drink clear liquids until 07:30am the morning of your surgery.   Clear liquids allowed are: Water, Non-Citrus Juices (without pulp), Carbonated Beverages, Clear Tea, Black Coffee Only, and Gatorade    Take these medicines the morning of surgery with A SIP OF WATER: NONE   As of today, STOP taking any Aspirin (unless otherwise instructed by your surgeon) Aleve, Naproxen, Ibuprofen, Motrin, Advil, Goody's, BC's, all herbal medications, fish oil, and all vitamins.                     Do not wear jewelry, make up, or nail polish            Do not wear lotions, powders, perfumes/colognes, or deodorant.            Do not shave 48 hours prior to surgery.              Do not bring valuables to the hospital.            [facility] is not responsible for any belongings or valuables.  Do NOT Smoke (Tobacco/Vaping) or drink Alcohol 24 hours prior to your procedure If you use a CPAP at night, you may bring all equipment for your overnight stay.   Contacts, glasses, dentures or bridgework may not be worn into surgery, please bring cases for these belongings   For patients admitted to the hospital, discharge time will be determined by your treatment team.   Patients discharged the day of surgery will not be allowed to drive home, and someone needs to stay with them for 24 hours.    Special instructions:   [place]- Preparing For Surgery  Before surgery, you can play an important role. Because skin is not sterile, your skin needs to be as free of germs as possible. You can  reduce the number of germs on your skin by washing with CHG (chlorahexidine gluconate) Soap before surgery.  CHG is an antiseptic cleaner which kills germs and bonds with the skin to continue killing germs even after washing.    Oral Hygiene is also important to reduce your risk of infection.  Remember - BRUSH YOUR TEETH THE MORNING OF SURGERY WITH YOUR REGULAR TOOTHPASTE  Please do not use if you have an allergy to CHG or antibacterial soaps. If your skin becomes reddened/irritated stop using the CHG.  Do not shave (including legs and underarms) for at least 48 hours prior to first CHG shower. It is OK to shave your face.  Please follow these instructions carefully.   1. Shower the NIGHT BEFORE SURGERY and the MORNING OF SURGERY  2. If you chose to wash your hair, wash your hair first as usual with your normal shampoo.  3. After you shampoo, rinse your hair and body thoroughly to remove the shampoo.  4. Wash Face and genitals (private parts) with your normal soap.   5.  Shower the NIGHT BEFORE SURGERY and the MORNING OF SURGERY with CHG Soap.   6. Use CHG Soap as you would any  other liquid soap. You can apply CHG directly to the skin and wash gently with a scrungie or a clean washcloth.   7. Apply the CHG Soap to your body ONLY FROM THE NECK DOWN.  Do not use on open wounds or open sores. Avoid contact with your eyes, ears, mouth and genitals (private parts). Wash Face and genitals (private parts)  with your normal soap.   8. Wash thoroughly, paying special attention to the area where your surgery will be performed.  9. Thoroughly rinse your body with warm water from the neck down.  10. DO NOT shower/wash with your normal soap after using and rinsing off the CHG Soap.  11. Pat yourself dry with a CLEAN TOWEL.  12. Wear CLEAN PAJAMAS to bed the night before surgery  13. Place CLEAN SHEETS on your bed the night before your surgery  14. DO NOT SLEEP WITH PETS.   Day of  Surgery: Take a shower.  Wear Clean/Comfortable clothing the morning of surgery Do not apply any deodorants/lotions.   Remember to brush your teeth WITH YOUR REGULAR TOOTHPASTE.   Please read over the following fact sheets that you were given.

## 2020-03-08 ENCOUNTER — Encounter (HOSPITAL_COMMUNITY)
Admission: RE | Admit: 2020-03-08 | Discharge: 2020-03-08 | Disposition: A | Discharge status: Home / Self Care | Payer: No Typology Code available for payment source | Source: Ambulatory Visit | Attending: Surgery | Admitting: Surgery

## 2020-03-08 ENCOUNTER — Other Ambulatory Visit (HOSPITAL_COMMUNITY)
Admission: RE | Admit: 2020-03-08 | Discharge: 2020-03-08 | Disposition: A | Discharge status: Home / Self Care | Payer: No Typology Code available for payment source | Source: Ambulatory Visit | Attending: Surgery | Admitting: Surgery

## 2020-03-08 ENCOUNTER — Other Ambulatory Visit: Payer: Self-pay

## 2020-03-08 ENCOUNTER — Encounter (HOSPITAL_COMMUNITY): Payer: Self-pay

## 2020-03-08 DIAGNOSIS — Z20822 Contact with and (suspected) exposure to covid-19: Secondary | ICD-10-CM | POA: Insufficient documentation

## 2020-03-08 DIAGNOSIS — Z01812 Encounter for preprocedural laboratory examination: Secondary | ICD-10-CM | POA: Insufficient documentation

## 2020-03-08 NOTE — Progress Notes (Signed)
PCP - Denies  Gyn- Dr. Gaetano Net  Cardiologist - Denies  Chest x-ray - Denies  EKG - Denies  Stress Test - Denies  ECHO - Denies  Cardiac Cath - Denies  AICD-na PM-na LOOP-na  Sleep Study - Denies CPAP - Denies  LABS- 02/26/20: CBC, CMP (E) 03/11/20- POC UPreg  ASA- Denies  ERAS- Yes- no drink  HA1C- Denies  Anesthesia- No  Pt denies having chest pain, sob, or fever at this time. All instructions explained to the pt, with a verbal understanding of the material. Pt agrees to go over the instructions while at home for a better understanding. Pt also instructed to self quarantine after being tested for COVID-19. The opportunity to ask questions was provided.   Coronavirus Screening  Have you experienced the following symptoms:  Cough yes/no: No Fever (>100.16F)  yes/no: No Runny nose yes/no: No Sore throat yes/no: No Difficulty breathing/shortness of breath  yes/no: No  Have you or a family member traveled in the last 14 days and where? yes/no: No   If the patient indicates "YES" to the above questions, their PAT will be rescheduled to limit the exposure to others and, the surgeon will be notified. THE PATIENT WILL NEED TO BE ASYMPTOMATIC FOR 14 DAYS.   If the patient is not experiencing any of these symptoms, the PAT nurse will instruct them to NOT bring anyone with them to their appointment since they may have these symptoms or traveled as well.   Please remind your patients and families that hospital visitation restrictions are in effect and the importance of the restrictions.

## 2020-03-09 LAB — SARS CORONAVIRUS 2 (TAT 6-24 HRS): SARS Coronavirus 2: NEGATIVE

## 2020-03-10 ENCOUNTER — Other Ambulatory Visit: Payer: Self-pay

## 2020-03-10 ENCOUNTER — Ambulatory Visit
Admission: RE | Admit: 2020-03-10 | Discharge: 2020-03-10 | Disposition: A | Discharge status: Home / Self Care | Payer: No Typology Code available for payment source | Source: Ambulatory Visit | Attending: Surgery | Admitting: Surgery

## 2020-03-10 DIAGNOSIS — C50912 Malignant neoplasm of unspecified site of left female breast: Secondary | ICD-10-CM

## 2020-03-11 ENCOUNTER — Encounter (HOSPITAL_COMMUNITY)
Admission: RE | Admit: 2020-03-11 | Discharge: 2020-03-11 | Disposition: A | Discharge status: Home / Self Care | Payer: No Typology Code available for payment source | Source: Ambulatory Visit | Attending: Surgery | Admitting: Surgery

## 2020-03-11 ENCOUNTER — Encounter (HOSPITAL_COMMUNITY): Payer: Self-pay | Admitting: Surgery

## 2020-03-11 ENCOUNTER — Ambulatory Visit
Admission: RE | Admit: 2020-03-11 | Discharge: 2020-03-11 | Disposition: A | Discharge status: Home / Self Care | Payer: No Typology Code available for payment source | Source: Ambulatory Visit | Attending: Surgery | Admitting: Surgery

## 2020-03-11 ENCOUNTER — Ambulatory Visit (HOSPITAL_COMMUNITY)
Admission: RE | Admit: 2020-03-11 | Discharge: 2020-03-11 | Disposition: A | Discharge status: Home / Self Care | Payer: No Typology Code available for payment source | Attending: Surgery | Admitting: Surgery

## 2020-03-11 ENCOUNTER — Ambulatory Visit (HOSPITAL_COMMUNITY): Payer: No Typology Code available for payment source | Admitting: Certified Registered"

## 2020-03-11 ENCOUNTER — Other Ambulatory Visit: Payer: Self-pay

## 2020-03-11 ENCOUNTER — Encounter (HOSPITAL_COMMUNITY)
Admission: RE | Disposition: A | Discharge status: Home / Self Care | Payer: Self-pay | Source: Home / Self Care | Attending: Surgery

## 2020-03-11 DIAGNOSIS — Z808 Family history of malignant neoplasm of other organs or systems: Secondary | ICD-10-CM | POA: Diagnosis not present

## 2020-03-11 DIAGNOSIS — F17210 Nicotine dependence, cigarettes, uncomplicated: Secondary | ICD-10-CM | POA: Diagnosis not present

## 2020-03-11 DIAGNOSIS — Z882 Allergy status to sulfonamides status: Secondary | ICD-10-CM | POA: Insufficient documentation

## 2020-03-11 DIAGNOSIS — Z8041 Family history of malignant neoplasm of ovary: Secondary | ICD-10-CM | POA: Diagnosis not present

## 2020-03-11 DIAGNOSIS — C50912 Malignant neoplasm of unspecified site of left female breast: Secondary | ICD-10-CM | POA: Diagnosis not present

## 2020-03-11 DIAGNOSIS — Z170 Estrogen receptor positive status [ER+]: Secondary | ICD-10-CM | POA: Insufficient documentation

## 2020-03-11 DIAGNOSIS — Z803 Family history of malignant neoplasm of breast: Secondary | ICD-10-CM | POA: Insufficient documentation

## 2020-03-11 DIAGNOSIS — D360 Benign neoplasm of lymph nodes: Secondary | ICD-10-CM | POA: Insufficient documentation

## 2020-03-11 DIAGNOSIS — Z885 Allergy status to narcotic agent status: Secondary | ICD-10-CM | POA: Insufficient documentation

## 2020-03-11 HISTORY — PX: BREAST LUMPECTOMY WITH RADIOACTIVE SEED AND SENTINEL LYMPH NODE BIOPSY: SHX6550

## 2020-03-11 LAB — POCT PREGNANCY, URINE: Preg Test, Ur: NEGATIVE

## 2020-03-11 SURGERY — BREAST LUMPECTOMY WITH RADIOACTIVE SEED AND SENTINEL LYMPH NODE BIOPSY
Anesthesia: General | Site: Breast | Laterality: Left

## 2020-03-11 MED ORDER — MIDAZOLAM HCL 2 MG/2ML IJ SOLN: 2.0000 mg | Freq: Once | INTRAMUSCULAR | Status: AC

## 2020-03-11 MED ORDER — FENTANYL CITRATE (PF) 250 MCG/5ML IJ SOLN
INTRAMUSCULAR | Status: DC | PRN
  Administered 2020-03-11: 50 ug via INTRAVENOUS
  Administered 2020-03-11: 25 ug via INTRAVENOUS

## 2020-03-11 MED ORDER — BUPIVACAINE HCL (PF) 0.25 % IJ SOLN
INTRAMUSCULAR | Status: DC | PRN
  Administered 2020-03-11: 10 mL

## 2020-03-11 MED ORDER — FENTANYL CITRATE (PF) 100 MCG/2ML IJ SOLN
INTRAMUSCULAR | Status: AC
  Filled 2020-03-11: qty 2

## 2020-03-11 MED ORDER — ONDANSETRON HCL 4 MG/2ML IJ SOLN
INTRAMUSCULAR | Status: DC | PRN
  Administered 2020-03-11: 4 mg via INTRAVENOUS

## 2020-03-11 MED ORDER — DEXAMETHASONE SODIUM PHOSPHATE 10 MG/ML IJ SOLN
INTRAMUSCULAR | Status: DC | PRN
  Administered 2020-03-11: 5 mg via INTRAVENOUS

## 2020-03-11 MED ORDER — TECHNETIUM TC 99M TILMANOCEPT KIT
1.0000 | PACK | Freq: Once | INTRAVENOUS | Status: AC | PRN
  Administered 2020-03-11: 1 via INTRADERMAL

## 2020-03-11 MED ORDER — AMISULPRIDE (ANTIEMETIC) 5 MG/2ML IV SOLN: 10.0000 mg | Freq: Once | INTRAVENOUS | Status: DC | PRN

## 2020-03-11 MED ORDER — FENTANYL CITRATE (PF) 250 MCG/5ML IJ SOLN
INTRAMUSCULAR | Status: AC
  Filled 2020-03-11: qty 5

## 2020-03-11 MED ORDER — ACETAMINOPHEN 500 MG PO TABS
ORAL_TABLET | ORAL | Status: AC
  Administered 2020-03-11: 1000 mg via ORAL
  Filled 2020-03-11: qty 2

## 2020-03-11 MED ORDER — CHLORHEXIDINE GLUCONATE CLOTH 2 % EX PADS: 6.0000 | MEDICATED_PAD | Freq: Once | CUTANEOUS | Status: DC

## 2020-03-11 MED ORDER — GABAPENTIN 300 MG PO CAPS
ORAL_CAPSULE | ORAL | Status: AC
  Administered 2020-03-11: 300 mg via ORAL
  Filled 2020-03-11: qty 1

## 2020-03-11 MED ORDER — GABAPENTIN 300 MG PO CAPS: 300.0000 mg | ORAL_CAPSULE | ORAL | Status: AC

## 2020-03-11 MED ORDER — BUPIVACAINE HCL (PF) 0.25 % IJ SOLN
INTRAMUSCULAR | Status: AC
  Filled 2020-03-11: qty 30

## 2020-03-11 MED ORDER — LIDOCAINE 2% (20 MG/ML) 5 ML SYRINGE
INTRAMUSCULAR | Status: DC | PRN
  Administered 2020-03-11: 60 mg via INTRAVENOUS

## 2020-03-11 MED ORDER — PROPOFOL 10 MG/ML IV BOLUS
INTRAVENOUS | Status: AC
  Filled 2020-03-11: qty 20

## 2020-03-11 MED ORDER — PHENYLEPHRINE 40 MCG/ML (10ML) SYRINGE FOR IV PUSH (FOR BLOOD PRESSURE SUPPORT)
PREFILLED_SYRINGE | INTRAVENOUS | Status: DC | PRN
  Administered 2020-03-11 (×2): 120 ug via INTRAVENOUS
  Administered 2020-03-11 (×2): 80 ug via INTRAVENOUS

## 2020-03-11 MED ORDER — SODIUM CHLORIDE (PF) 0.9 % IJ SOLN
INTRAMUSCULAR | Status: AC
  Filled 2020-03-11: qty 10

## 2020-03-11 MED ORDER — PROPOFOL 10 MG/ML IV BOLUS
INTRAVENOUS | Status: DC | PRN
  Administered 2020-03-11: 200 mg via INTRAVENOUS

## 2020-03-11 MED ORDER — OXYCODONE HCL 5 MG PO TABS: 5.0000 mg | ORAL_TABLET | Freq: Once | ORAL | Status: DC | PRN

## 2020-03-11 MED ORDER — SODIUM CHLORIDE (PF) 0.9 % IJ SOLN
INTRAVENOUS | Status: DC | PRN
  Administered 2020-03-11: 5 mL via INTRAMUSCULAR

## 2020-03-11 MED ORDER — FENTANYL CITRATE (PF) 100 MCG/2ML IJ SOLN
50.0000 ug | Freq: Once | INTRAMUSCULAR | Status: AC
Start: 2020-03-11 — End: 2020-03-11

## 2020-03-11 MED ORDER — FENTANYL CITRATE (PF) 100 MCG/2ML IJ SOLN
25.0000 ug | INTRAMUSCULAR | Status: DC | PRN
  Administered 2020-03-11 (×2): 25 ug via INTRAVENOUS

## 2020-03-11 MED ORDER — CEFAZOLIN SODIUM-DEXTROSE 2-4 GM/100ML-% IV SOLN
INTRAVENOUS | Status: AC
  Filled 2020-03-11: qty 100

## 2020-03-11 MED ORDER — PHENYLEPHRINE 40 MCG/ML (10ML) SYRINGE FOR IV PUSH (FOR BLOOD PRESSURE SUPPORT)
PREFILLED_SYRINGE | INTRAVENOUS | Status: AC
  Filled 2020-03-11: qty 10

## 2020-03-11 MED ORDER — CHLORHEXIDINE GLUCONATE 0.12 % MT SOLN: 15.0000 mL | Freq: Once | OROMUCOSAL | Status: AC

## 2020-03-11 MED ORDER — ONDANSETRON HCL 4 MG/2ML IJ SOLN
INTRAMUSCULAR | Status: AC
  Filled 2020-03-11: qty 2

## 2020-03-11 MED ORDER — ACETAMINOPHEN 500 MG PO TABS: 1000.0000 mg | ORAL_TABLET | ORAL | Status: AC

## 2020-03-11 MED ORDER — OXYCODONE HCL 5 MG/5ML PO SOLN: 5.0000 mg | Freq: Once | ORAL | Status: DC | PRN

## 2020-03-11 MED ORDER — FENTANYL CITRATE (PF) 100 MCG/2ML IJ SOLN
INTRAMUSCULAR | Status: AC
  Administered 2020-03-11: 50 ug via INTRAVENOUS
  Filled 2020-03-11: qty 2

## 2020-03-11 MED ORDER — ONDANSETRON HCL 4 MG/2ML IJ SOLN
4.0000 mg | Freq: Once | INTRAMUSCULAR | Status: AC | PRN
  Administered 2020-03-11: 4 mg via INTRAVENOUS

## 2020-03-11 MED ORDER — CEFAZOLIN SODIUM-DEXTROSE 2-4 GM/100ML-% IV SOLN
2.0000 g | INTRAVENOUS | Status: AC
  Administered 2020-03-11: 2 g via INTRAVENOUS

## 2020-03-11 MED ORDER — 0.9 % SODIUM CHLORIDE (POUR BTL) OPTIME
TOPICAL | Status: DC | PRN
  Administered 2020-03-11: 1000 mL

## 2020-03-11 MED ORDER — DEXAMETHASONE SODIUM PHOSPHATE 10 MG/ML IJ SOLN
INTRAMUSCULAR | Status: AC
  Filled 2020-03-11: qty 1

## 2020-03-11 MED ORDER — MIDAZOLAM HCL 2 MG/2ML IJ SOLN
INTRAMUSCULAR | Status: AC
  Filled 2020-03-11: qty 2

## 2020-03-11 MED ORDER — ORAL CARE MOUTH RINSE: 15.0000 mL | Freq: Once | OROMUCOSAL | Status: AC

## 2020-03-11 MED ORDER — LIDOCAINE 2% (20 MG/ML) 5 ML SYRINGE
INTRAMUSCULAR | Status: AC
  Filled 2020-03-11: qty 5

## 2020-03-11 MED ORDER — CHLORHEXIDINE GLUCONATE 0.12 % MT SOLN
OROMUCOSAL | Status: AC
  Administered 2020-03-11: 15 mL via OROMUCOSAL
  Filled 2020-03-11: qty 15

## 2020-03-11 MED ORDER — MIDAZOLAM HCL 2 MG/2ML IJ SOLN
INTRAMUSCULAR | Status: AC
  Administered 2020-03-11: 2 mg via INTRAVENOUS
  Filled 2020-03-11: qty 2

## 2020-03-11 MED ORDER — ROPIVACAINE HCL 5 MG/ML IJ SOLN
INTRAMUSCULAR | Status: DC | PRN
  Administered 2020-03-11: 30 mL via PERINEURAL

## 2020-03-11 MED ORDER — METHYLENE BLUE 0.5 % INJ SOLN
INTRAVENOUS | Status: AC
  Filled 2020-03-11: qty 10

## 2020-03-11 MED ORDER — TRAMADOL HCL 50 MG PO TABS: 50.0000 mg | ORAL_TABLET | Freq: Four times a day (QID) | ORAL | 0 refills | Status: AC | PRN

## 2020-03-11 MED ORDER — LACTATED RINGERS IV SOLN: INTRAVENOUS | Status: DC

## 2020-03-11 SURGICAL SUPPLY — 48 items
APL PRP STRL LF DISP 70% ISPRP (MISCELLANEOUS) ×1
APL SKNCLS STERI-STRIP NONHPOA (GAUZE/BANDAGES/DRESSINGS) ×2
APPLIER CLIP 9.375 MED OPEN (MISCELLANEOUS) ×2
APR CLP MED 9.3 20 MLT OPN (MISCELLANEOUS) ×1
BENZOIN TINCTURE PRP APPL 2/3 (GAUZE/BANDAGES/DRESSINGS) ×3 IMPLANT
BINDER BREAST LRG (GAUZE/BANDAGES/DRESSINGS) ×1 IMPLANT
CANISTER SUCT 3000ML PPV (MISCELLANEOUS) ×2 IMPLANT
CHLORAPREP W/TINT 26 (MISCELLANEOUS) ×2 IMPLANT
CLIP APPLIE 9.375 MED OPEN (MISCELLANEOUS) ×1 IMPLANT
CLSR STERI-STRIP ANTIMIC 1/2X4 (GAUZE/BANDAGES/DRESSINGS) ×2 IMPLANT
CNTNR URN SCR LID CUP LEK RST (MISCELLANEOUS) ×1 IMPLANT
CONT SPEC 4OZ STRL OR WHT (MISCELLANEOUS) ×2
COVER PROBE W GEL 5X96 (DRAPES) ×2 IMPLANT
COVER SURGICAL LIGHT HANDLE (MISCELLANEOUS) ×2 IMPLANT
COVER WAND RF STERILE (DRAPES) ×2 IMPLANT
DEVICE DUBIN SPECIMEN MAMMOGRA (MISCELLANEOUS) ×2 IMPLANT
DRAPE CHEST BREAST 15X10 FENES (DRAPES) ×2 IMPLANT
DRSG TEGADERM 4X4.75 (GAUZE/BANDAGES/DRESSINGS) ×4 IMPLANT
ELECT CAUTERY BLADE 6.4 (BLADE) ×2 IMPLANT
ELECT REM PT RETURN 9FT ADLT (ELECTROSURGICAL) ×2
ELECTRODE REM PT RTRN 9FT ADLT (ELECTROSURGICAL) ×1 IMPLANT
GAUZE SPONGE 2X2 8PLY STRL LF (GAUZE/BANDAGES/DRESSINGS) ×1 IMPLANT
GLOVE BIO SURGEON STRL SZ7 (GLOVE) ×2 IMPLANT
GLOVE BIOGEL PI IND STRL 7.5 (GLOVE) ×1 IMPLANT
GLOVE BIOGEL PI INDICATOR 7.5 (GLOVE) ×1
GOWN STRL REUS W/ TWL LRG LVL3 (GOWN DISPOSABLE) ×2 IMPLANT
GOWN STRL REUS W/TWL LRG LVL3 (GOWN DISPOSABLE) ×4
KIT BASIN OR (CUSTOM PROCEDURE TRAY) ×2 IMPLANT
KIT MARKER MARGIN INK (KITS) IMPLANT
LIGHT WAVEGUIDE WIDE FLAT (MISCELLANEOUS) IMPLANT
NDL 18GX1X1/2 (RX/OR ONLY) (NEEDLE) ×1 IMPLANT
NDL FILTER BLUNT 18X1 1/2 (NEEDLE) ×1 IMPLANT
NDL HYPO 25GX1X1/2 BEV (NEEDLE) ×2 IMPLANT
NEEDLE 18GX1X1/2 (RX/OR ONLY) (NEEDLE) ×2 IMPLANT
NEEDLE FILTER BLUNT 18X 1/2SAF (NEEDLE) ×1
NEEDLE FILTER BLUNT 18X1 1/2 (NEEDLE) ×1 IMPLANT
NEEDLE HYPO 25GX1X1/2 BEV (NEEDLE) ×4 IMPLANT
NS IRRIG 1000ML POUR BTL (IV SOLUTION) ×2 IMPLANT
PACK GENERAL/GYN (CUSTOM PROCEDURE TRAY) ×2 IMPLANT
SPONGE GAUZE 2X2 STER 10/PKG (GAUZE/BANDAGES/DRESSINGS) ×2
SPONGE LAP 4X18 RFD (DISPOSABLE) ×2 IMPLANT
STRIP CLOSURE SKIN 1/2X4 (GAUZE/BANDAGES/DRESSINGS) ×1 IMPLANT
SUT MNCRL AB 4-0 PS2 18 (SUTURE) ×4 IMPLANT
SUT VIC AB 3-0 SH 27 (SUTURE) ×4
SUT VIC AB 3-0 SH 27X BRD (SUTURE) ×2 IMPLANT
SYR CONTROL 10ML LL (SYRINGE) ×4 IMPLANT
TOWEL GREEN STERILE (TOWEL DISPOSABLE) ×2 IMPLANT
TOWEL GREEN STERILE FF (TOWEL DISPOSABLE) ×2 IMPLANT

## 2020-03-11 NOTE — Op Note (Signed)
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 Anesthesia:  GEN - LMA/ PEC block Indications:  This is a 50 year old female with the family history of breast cancer her mother who passed away of breast cancer. She presents after recent screening mammogram. This showed a mass in the left breast. She underwent further workup which revealed a 0.8 x 0.5 x 0.8 cm irregular spiculated mass in the left breast at 6:00 2 cm from the nipple. This area was biopsied and revealed diagnosis of invasive ductal carcinoma ER/PR positive, Ki-67 10%, HER-2 negative, Nottingham grade 2. She presents now for initial surgical evaluation.  Description of procedure: The patient is brought to the operating room placed in supine position on the operating room table. After an adequate level of general anesthesia was obtained, we injected methylene blue dye solution into the dermis around her nipple.  Her left breast was prepped with ChloraPrep and draped in sterile fashion. A timeout was taken to ensure the proper patient and proper procedure. We interrogated the breast with the neoprobe. We made a circumareolar incision around the lower side of the nipple after infiltrating with 0.25% Marcaine. Dissection was carried down in the breast tissue with cautery. We used the neoprobe to guide [place] towards the radioactive seed. We excised an area of tissue around the radioactive seed 1.5 cm in diameter. The seed became dislodged and was removed separately. The specimen was removed and was oriented with a paint kit. Specimen mammogram showed the radioactive seed as well as the biopsy clip within the specimen. This was sent for pathologic examination. There is no residual radioactivity within the biopsy cavity. We inspected carefully for hemostasis. The wound was thoroughly  irrigated.   We then turned our attention to the axilla.  The settings were adjusted on the Neoprobe and we interrogated the axilla.  I made a transverse incision over the area of activity.  We dissected into the axilla and identified a radioactive lymph node.  Some blue dye was seen within the node.  This was dissected free and sent as "sentinel lymph node #1".  We interrogated the axilla again and a second node was identified.  This was sent as "sentinel lymph node #2."  Two additional adjacent lymph nodes were also sent to pathology.  There was minimal background activity.  The wounds were closed with a deep layer of 3-0 Vicryl and a subcuticular layer of 4-0 Monocryl. Benzoin Steri-Strips were applied. The patient was then extubated and brought to the recovery room in stable condition. All sponge, instrument, and needle counts are correct.  [name]. Georgette Dover, MD, [facility]  General/ Trauma Surgery  12/08/2019 1:38 PM

## 2020-03-11 NOTE — Anesthesia Procedure Notes (Signed)
Procedure Name: LMA Insertion Date/Time: 03/11/2020 10:22 AM Performed by: Barrington Ellison, CRNA Pre-anesthesia Checklist: Patient identified, Emergency Drugs available, Suction available and Patient being monitored Patient Re-evaluated:Patient Re-evaluated prior to induction Oxygen Delivery Method: Circle System Utilized Preoxygenation: Pre-oxygenation with 100% oxygen Induction Type: IV induction Ventilation: Mask ventilation without difficulty LMA: LMA inserted LMA Size: 4.0 Number of attempts: 1 Placement Confirmation: positive ETCO2 Tube secured with: Tape Dental Injury: Teeth and Oropharynx as per pre-operative assessment

## 2020-03-11 NOTE — Anesthesia Preprocedure Evaluation (Signed)
Anesthesia Evaluation  Patient identified by MRN, date of birth, ID band Patient awake    Reviewed: Allergy & Precautions, NPO status , Patient's Chart, lab work & pertinent test results  History of Anesthesia Complications Negative for: history of anesthetic complications  Airway Mallampati: III  TM Distance: >3 FB Neck ROM: Full    Dental  (+) Teeth Intact   Pulmonary Current Smoker,    Pulmonary exam normal        Cardiovascular negative cardio ROS Normal cardiovascular exam     Neuro/Psych negative neurological ROS  negative psych ROS   GI/Hepatic negative GI ROS, Neg liver ROS,   Endo/Other  negative endocrine ROS  Renal/GU negative Renal ROS  negative genitourinary   Musculoskeletal negative musculoskeletal ROS (+)   Abdominal   Peds  Hematology negative hematology ROS (+)   Anesthesia Other Findings  Breast cancer  Reproductive/Obstetrics                            Anesthesia Physical Anesthesia Plan  ASA: II  Anesthesia Plan: General   Post-op Pain Management: GA combined w/ Regional for post-op pain   Induction: Intravenous  PONV Risk Score and Plan: 2 and Ondansetron, Dexamethasone, Midazolam and Treatment may vary due to age or medical condition  Airway Management Planned: LMA  Additional Equipment: None  Intra-op Plan:   Post-operative Plan: Extubation in OR  Informed Consent: I have reviewed the patients History and Physical, chart, labs and discussed the procedure including the risks, benefits and alternatives for the proposed anesthesia with the patient or authorized representative who has indicated his/her understanding and acceptance.     Dental advisory given  Plan Discussed with:   Anesthesia Plan Comments:         Anesthesia Quick Evaluation

## 2020-03-11 NOTE — Anesthesia Procedure Notes (Signed)
Anesthesia Regional Block: Pectoralis block   Pre-Anesthetic Checklist: ,, timeout performed, Correct Patient, Correct Site, Correct Laterality, Correct Procedure, Correct Position, site marked, Risks and benefits discussed,  Surgical consent,  Pre-op evaluation,  At surgeon's request and post-op pain management  Laterality: Left  Prep: chloraprep       Needles:  Injection technique: Single-shot  Needle Type: Echogenic Stimulator Needle     Needle Length: 10cm  Needle Gauge: 20     Additional Needles:   Procedures:,,,, ultrasound used (permanent image in chart),,,,  Narrative:  Start time: 03/11/2020 9:43 AM End time: 03/11/2020 9:47 AM Injection made incrementally with aspirations every 5 mL.  Performed by: Personally  Anesthesiologist: Lidia Collum, MD  Additional Notes: Standard monitors applied. Skin prepped. Good needle visualization with ultrasound. Injection made in 5cc increments with no resistance to injection. Patient tolerated the procedure well.

## 2020-03-11 NOTE — Interval H&P Note (Signed)
History and Physical Interval Note:  03/11/2020 9:58 AM  Susan Daniel  has presented today for surgery, with the diagnosis of LEFT INVASIVE DUCTAL CARCINOMA.  The various methods of treatment have been discussed with the patient and family. After consideration of risks, benefits and other options for treatment, the patient has consented to  Procedure(s) with comments: LEFT BREAST LUMPECTOMY WITH RADIOACTIVE SEED AND LEFT AXILLARY SENTINEL LYMPH NODE BIOPSY WITH BLUE DYE INJECTION (Left) - LMA & PECTORAL BLOCK; START TIME 10:30 AM FOR 90 MINUTES IN ROOM 2 as a surgical intervention.  The patient's history has been reviewed, patient examined, no change in status, stable for surgery.  I have reviewed the patient's chart and labs.  Questions were answered to the patient's satisfaction.     Maia Petties

## 2020-03-11 NOTE — Transfer of Care (Signed)
Immediate Anesthesia Transfer of Care Note  Patient: Susan Daniel  Procedure(s) Performed: LEFT BREAST LUMPECTOMY WITH RADIOACTIVE SEED AND LEFT AXILLARY SENTINEL LYMPH NODE BIOPSY WITH BLUE DYE INJECTION (Left Breast)  Patient Location: PACU  Anesthesia Type:General and Regional  Level of Consciousness: awake and oriented  Airway & Oxygen Therapy: Patient Spontanous Breathing  Post-op Assessment: Report given to RN  Post vital signs: Reviewed and stable  Last Vitals:  Vitals Value Taken Time  BP 133/73 03/11/20 1145  Temp    Pulse 60 03/11/20 1146  Resp    SpO2 100 % 03/11/20 1146  Vitals shown include unvalidated device data.  Last Pain:  Vitals:   03/11/20 1000  PainSc: 0-No pain      Patients Stated Pain Goal: 3 (59/93/57 0177)  Complications: No complications documented.

## 2020-03-11 NOTE — Discharge Instructions (Signed)
[facility] °Office Phone Number [phone] ° °BREAST BIOPSY/ PARTIAL MASTECTOMY: POST OP INSTRUCTIONS ° °Always review your discharge instruction sheet given to you by the facility where your surgery was performed. ° °IF YOU HAVE DISABILITY OR FAMILY LEAVE FORMS, YOU MUST BRING THEM TO THE OFFICE FOR PROCESSING.  DO NOT GIVE THEM TO YOUR DOCTOR. ° °1. A prescription for pain medication may be given to you upon discharge.  Take your pain medication as prescribed, if needed.  If narcotic pain medicine is not needed, then you may take acetaminophen (Tylenol) or ibuprofen (Advil) as needed. °2. Take your usually prescribed medications unless otherwise directed °3. If you need a refill on your pain medication, please contact your pharmacy.  They will contact our office to request authorization.  Prescriptions will not be filled after 5pm or on week-ends. °4. You should eat very light the first 24 hours after surgery, such as soup, crackers, pudding, etc.  Resume your normal diet the day after surgery. °5. Most patients will experience some swelling and bruising in the breast.  Ice packs and a good support bra will help.  Swelling and bruising can take several days to resolve.  °6. It is common to experience some constipation if taking pain medication after surgery.  Increasing fluid intake and taking a stool softener will usually help or prevent this problem from occurring.  A mild laxative (Milk of Magnesia or Miralax) should be taken according to package directions if there are no bowel movements after 48 hours. °7. Unless discharge instructions indicate otherwise, you may remove your bandages 24-48 hours after surgery, and you may shower at that time.  You may have steri-strips (small skin tapes) in place directly over the incision.  These strips should be left on the skin for 7-10 days.  If your surgeon used skin glue on the incision, you may shower in 24 hours.  The glue will flake off over the  next 2-3 weeks.  Any sutures or staples will be removed at the office during your follow-up visit. °8. ACTIVITIES:  You may resume regular daily activities (gradually increasing) beginning the next day.  Wearing a good support bra or sports bra minimizes pain and swelling.  You may have sexual intercourse when it is comfortable. °a. You may drive when you no longer are taking prescription pain medication, you can comfortably wear a seatbelt, and you can safely maneuver your car and apply brakes. °b. RETURN TO WORK:  ______________________________________________________________________________________ °9. You should see your doctor in the office for a follow-up appointment approximately two weeks after your surgery.  Your doctor’s nurse will typically make your follow-up appointment when she calls you with your pathology report.  Expect your pathology report 2-3 business days after your surgery.  You may call to check if you do not hear from us after three days. °10. OTHER INSTRUCTIONS: _______________________________________________________________________________________________ _____________________________________________________________________________________________________________________________________ °_____________________________________________________________________________________________________________________________________ °_____________________________________________________________________________________________________________________________________ ° °WHEN TO CALL YOUR DOCTOR: °1. Fever over 101.0 °2. Nausea and/or vomiting. °3. Extreme swelling or bruising. °4. Continued bleeding from incision. °5. Increased pain, redness, or drainage from the incision. ° °The clinic staff is available to answer your questions during regular business hours.  Please don’t hesitate to call and ask to speak to one of the nurses for clinical concerns.  If you have a medical emergency, go to the nearest  emergency room or call 911.  A surgeon from [facility] is always on call at the hospital. ° °For further questions, please visit [email or web address]  °

## 2020-03-11 NOTE — Anesthesia Postprocedure Evaluation (Signed)
Anesthesia Post Note  Patient: Susan Daniel  Procedure(s) Performed: LEFT BREAST LUMPECTOMY WITH RADIOACTIVE SEED AND LEFT AXILLARY SENTINEL LYMPH NODE BIOPSY WITH BLUE DYE INJECTION (Left Breast)     Patient location during evaluation: PACU Anesthesia Type: General Level of consciousness: awake and alert Pain management: pain level controlled Vital Signs Assessment: post-procedure vital signs reviewed and stable Respiratory status: spontaneous breathing, nonlabored ventilation and respiratory function stable Cardiovascular status: blood pressure returned to baseline and stable Postop Assessment: no apparent nausea or vomiting Anesthetic complications: no   No complications documented.  Last Vitals:  Vitals:   03/11/20 1315 03/11/20 1330  BP: 113/64 (!) 116/57  Pulse: 65 70  Resp: 12 16  Temp:  36.6 C  SpO2: 100% 99%    Last Pain:  Vitals:   03/11/20 1145  PainSc: 0-No pain                 Lidia Collum

## 2020-03-12 ENCOUNTER — Encounter (HOSPITAL_COMMUNITY): Payer: Self-pay | Admitting: Surgery

## 2020-03-12 ENCOUNTER — Telehealth: Payer: Self-pay | Admitting: Genetic Counselor

## 2020-03-12 ENCOUNTER — Ambulatory Visit: Payer: Self-pay | Admitting: Genetic Counselor

## 2020-03-12 DIAGNOSIS — Z1379 Encounter for other screening for genetic and chromosomal anomalies: Secondary | ICD-10-CM

## 2020-03-12 NOTE — Progress Notes (Signed)
HPI:  Ms. Skilton was previously seen in the [facility] due to a personal and family history of cancer and concerns regarding a hereditary predisposition to cancer. Please refer to our prior cancer genetics clinic note for more information regarding our discussion, assessment and recommendations, at the time. Ms. Dennard's recent genetic test results were disclosed to her, as were recommendations warranted by these results. These results and recommendations are discussed in more detail below.  CANCER HISTORY:  Oncology History  Malignant neoplasm of lower-outer quadrant of left breast of female, estrogen receptor positive (HCC)  02/13/2020 Initial Diagnosis   Malignant neoplasm of lower-outer quadrant of left breast of female, estrogen receptor positive (HCC)   02/17/2020 Cancer Staging   Staging form: Breast, AJCC 8th Edition - Clinical stage from 02/17/2020: Stage IA (cT1b, cN0, cM0, G2, ER+, PR+, HER2+) - Signed by Magrinat, Gustav C, MD on 02/26/2020 Stage prefix: Initial diagnosis Method of lymph node assessment: Clinical   03/10/2020 Genetic Testing   Negative genetic testing:  No pathogenic variants detected on the [facility] BRCAplus panel and CustomNext-Cancer + RNAinsight panel. The report dates are 03/01/2020 and 03/10/2020, respectively.   The BRCAplus panel offered by [facility] and includes sequencing and deletion/duplication analysis for the following 8 genes: ATM, BRCA1, BRCA2, CDH1, CHEK2, PALB2, PTEN, and TP53. The CustomNext-Cancer+RNAinsight panel offered by [facility] includes sequencing and rearrangement analysis for the following 47 genes:  APC, ATM, AXIN2, BARD1, BMPR1A, BRCA1, BRCA2, BRIP1, CDH1, CDK4, CDKN2A, CHEK2, DICER1, EPCAM, GREM1, HOXB13, MEN1, MLH1, MSH2, MSH3, MSH6, MUTYH, NBN, NF1, NF2, NTHL1, PALB2, PMS2, POLD1, POLE, PTEN, RAD51C, RAD51D, RECQL, RET, SDHA, SDHAF2, SDHB, SDHC, SDHD, SMAD4, SMARCA4, STK11, TP53, TSC1, TSC2, and VHL.  RNA data is  routinely analyzed for use in variant interpretation for all genes.     FAMILY HISTORY:  We obtained a detailed, 4-generation family history.  Significant diagnoses are listed below: Family History  Problem Relation Age of Onset  . Heart disease Paternal Grandfather   . Aneurysm Paternal Grandfather   . Diabetes Maternal Grandmother   . Heart disease Maternal Grandmother   . Breast cancer Mother 35       second diagnosis at age 45  . Kidney disease Maternal Grandfather   . Irritable bowel syndrome Other        Cousins  . Kidney disease Maternal Uncle   . Breast cancer Cousin        dx late 30s/40s; Maternal 1st cousin, daughter of pt's mother's brother  . Rectal cancer Maternal Aunt        dx late 30s/40s   Ms. Culliver has one daughter (age 12). Ms. Medders has no siblings.  Ms. Frett's mother died at age 48 and had a history of breast cancer twice (first diagnosed at age 35, then again at age 45). There is one maternal aunt and there were six maternal uncles. Her aunt was diagnosed with rectal cancer in her 30s or 40s. One maternal cousin was diagnosed with breast cancer in her late 30s or 40s. Ms. Hightower's maternal grandmother died at age 82 without cancer. Her maternal grandfather died at age 49 without cancer.  Ms. Markovitz's father died at age 55 without cancer. There are three paternal aunts and two paternal uncles. There is no known cancer among paternal aunts/uncles or paternal cousins. Ms. Cavenaugh's paternal grandmother died at age 82 without cancer. Her paternal grandfather died at age 79 without cancer.   Ms. Hinds is unaware of previous   family history of genetic testing for hereditary cancer risks. Patient's ancestors are of unknown descent. There is no reported Ashkenazi Jewish ancestry. There is no known consanguinity.  GENETIC TEST RESULTS: Genetic testing reported out on 03/01/2020 through the [facility] BRCAplus panel, and 03/10/2020 through the CustomNext-Cancer +  RNAinsight panel. No pathogenic variants were detected.   The BRCAplus panel offered by [facility] and includes sequencing and deletion/duplication analysis for the following 8 genes: ATM, BRCA1, BRCA2, CDH1, CHEK2, PALB2, PTEN, and TP53. The CustomNext-Cancer+RNAinsight panel offered by [facility] includes sequencing and rearrangement analysis for the following 47 genes:  APC, ATM, AXIN2, BARD1, BMPR1A, BRCA1, BRCA2, BRIP1, CDH1, CDK4, CDKN2A, CHEK2, DICER1, EPCAM, GREM1, HOXB13, MEN1, MLH1, MSH2, MSH3, MSH6, MUTYH, NBN, NF1, NF2, NTHL1, PALB2, PMS2, POLD1, POLE, PTEN, RAD51C, RAD51D, RECQL, RET, SDHA, SDHAF2, SDHB, SDHC, SDHD, SMAD4, SMARCA4, STK11, TP53, TSC1, TSC2, and VHL.  RNA data is routinely analyzed for use in variant interpretation for all genes. The test report will be scanned into EPIC and located under the Molecular Pathology section of the Results Review tab.  A portion of the result report is included below for reference.     We discussed with Ms. Steinmetz that because current genetic testing is not perfect, it is possible there may be a gene mutation in one of these genes that current testing cannot detect, but that chance is small.  We also discussed that there could be another gene that has not yet been discovered, or that we have not yet tested, that is responsible for the cancer diagnoses in the family. It is also possible there is a hereditary cause for the cancer in the family that Ms. Arcos did not inherit and therefore was not identified in her testing.  Therefore, it is important to remain in touch with cancer genetics in the future so that we can continue to offer Ms. Staples the most up to date genetic testing.   CANCER SCREENING RECOMMENDATIONS: Ms. Sawhney's test result is considered negative (normal).  This means that we have not identified a hereditary cause for her personal and family history of cancer at this time. While reassuring, this does not definitively rule  out a hereditary predisposition to cancer. It is still possible that there could be genetic mutations that are undetectable by current technology. There could be genetic mutations in genes that have not been tested or identified to increase cancer risk.  Therefore, it is recommended she continue to follow the cancer management and screening guidelines provided by her oncology and primary healthcare provider.   An individual's cancer risk and medical management are not determined by genetic test results alone. Overall cancer risk assessment incorporates additional factors, including personal medical history, family history, and any available genetic information that may result in a personalized plan for cancer prevention and surveillance.  RECOMMENDATIONS FOR FAMILY MEMBERS:  Individuals in this family might be at some increased risk of developing cancer, over the general population risk, simply due to the family history of cancer.  We recommended women in this family have a yearly mammogram beginning at age 40, or 10 years younger than the earliest onset of cancer, an annual clinical breast exam, and perform monthly breast self-exams. Women in this family should also have a gynecological exam as recommended by their primary provider. All family members should be referred for colonoscopy starting at age 45.  FOLLOW-UP: Lastly, we discussed with Ms. Dau that cancer genetics is a rapidly advancing field and it is possible that   new genetic tests will be appropriate for her and/or her family members in the future. We encouraged her to remain in contact with cancer genetics on an annual basis so we can update her personal and family histories and let her know of advances in cancer genetics that may benefit this family.   Our contact number was provided. Ms. Nickerson's questions were answered to her satisfaction, and she knows she is welcome to call us at anytime with additional questions or concerns.   Emily  Stiglich, MS, LCGC Genetic Counselor [email or web address] Phone: [phone]  

## 2020-03-12 NOTE — Telephone Encounter (Signed)
Revealed negative genetic testing through the [facility] CustomNext-Cancer + RNAinsight panel.

## 2020-03-15 LAB — SURGICAL PATHOLOGY

## 2020-03-16 ENCOUNTER — Ambulatory Visit: Payer: Self-pay | Admitting: Surgery

## 2020-03-18 ENCOUNTER — Other Ambulatory Visit: Payer: Self-pay

## 2020-03-18 ENCOUNTER — Encounter (HOSPITAL_BASED_OUTPATIENT_CLINIC_OR_DEPARTMENT_OTHER): Payer: Self-pay | Admitting: Surgery

## 2020-03-19 ENCOUNTER — Telehealth: Payer: Self-pay | Admitting: *Deleted

## 2020-03-19 ENCOUNTER — Other Ambulatory Visit (HOSPITAL_COMMUNITY): Payer: No Typology Code available for payment source

## 2020-03-19 ENCOUNTER — Encounter: Payer: Self-pay | Admitting: *Deleted

## 2020-03-19 NOTE — Progress Notes (Signed)

## 2020-03-19 NOTE — Telephone Encounter (Signed)
Received order for oncotype testing. Requisition faxed to pathology and [facility] °

## 2020-03-20 ENCOUNTER — Other Ambulatory Visit (HOSPITAL_COMMUNITY)
Admission: RE | Admit: 2020-03-20 | Discharge: 2020-03-20 | Disposition: A | Discharge status: Home / Self Care | Payer: No Typology Code available for payment source | Source: Ambulatory Visit | Attending: Surgery | Admitting: Surgery

## 2020-03-20 DIAGNOSIS — Z01812 Encounter for preprocedural laboratory examination: Secondary | ICD-10-CM | POA: Insufficient documentation

## 2020-03-20 DIAGNOSIS — Z20822 Contact with and (suspected) exposure to covid-19: Secondary | ICD-10-CM | POA: Insufficient documentation

## 2020-03-20 LAB — SARS CORONAVIRUS 2 (TAT 6-24 HRS): SARS Coronavirus 2: NEGATIVE

## 2020-03-23 ENCOUNTER — Ambulatory Visit (HOSPITAL_BASED_OUTPATIENT_CLINIC_OR_DEPARTMENT_OTHER)
Admission: RE | Admit: 2020-03-23 | Discharge: 2020-03-23 | Disposition: A | Discharge status: Home / Self Care | Payer: No Typology Code available for payment source | Attending: Surgery | Admitting: Surgery

## 2020-03-23 ENCOUNTER — Other Ambulatory Visit: Payer: Self-pay

## 2020-03-23 ENCOUNTER — Ambulatory Visit (HOSPITAL_BASED_OUTPATIENT_CLINIC_OR_DEPARTMENT_OTHER): Payer: No Typology Code available for payment source | Admitting: Anesthesiology

## 2020-03-23 ENCOUNTER — Encounter (HOSPITAL_BASED_OUTPATIENT_CLINIC_OR_DEPARTMENT_OTHER): Payer: Self-pay | Admitting: Surgery

## 2020-03-23 ENCOUNTER — Encounter (HOSPITAL_BASED_OUTPATIENT_CLINIC_OR_DEPARTMENT_OTHER)
Admission: RE | Disposition: A | Discharge status: Home / Self Care | Payer: Self-pay | Source: Home / Self Care | Attending: Surgery

## 2020-03-23 DIAGNOSIS — Z975 Presence of (intrauterine) contraceptive device: Secondary | ICD-10-CM | POA: Diagnosis not present

## 2020-03-23 DIAGNOSIS — Z803 Family history of malignant neoplasm of breast: Secondary | ICD-10-CM | POA: Diagnosis not present

## 2020-03-23 DIAGNOSIS — Z170 Estrogen receptor positive status [ER+]: Secondary | ICD-10-CM | POA: Insufficient documentation

## 2020-03-23 DIAGNOSIS — Z882 Allergy status to sulfonamides status: Secondary | ICD-10-CM | POA: Diagnosis not present

## 2020-03-23 DIAGNOSIS — F17210 Nicotine dependence, cigarettes, uncomplicated: Secondary | ICD-10-CM | POA: Insufficient documentation

## 2020-03-23 DIAGNOSIS — C50812 Malignant neoplasm of overlapping sites of left female breast: Secondary | ICD-10-CM | POA: Insufficient documentation

## 2020-03-23 DIAGNOSIS — C50912 Malignant neoplasm of unspecified site of left female breast: Secondary | ICD-10-CM | POA: Diagnosis present

## 2020-03-23 DIAGNOSIS — Z885 Allergy status to narcotic agent status: Secondary | ICD-10-CM | POA: Diagnosis not present

## 2020-03-23 HISTORY — PX: RE-EXCISION OF BREAST CANCER,SUPERIOR MARGINS: SHX6047

## 2020-03-23 HISTORY — DX: Other complications of anesthesia, initial encounter: T88.59XA

## 2020-03-23 HISTORY — PX: RE-EXCISION OF BREAST LUMPECTOMY: SHX6048

## 2020-03-23 LAB — POCT PREGNANCY, URINE: Preg Test, Ur: NEGATIVE

## 2020-03-23 SURGERY — EXCISION, LESION, BREAST
Anesthesia: General | Site: Breast | Laterality: Left

## 2020-03-23 MED ORDER — AMISULPRIDE (ANTIEMETIC) 5 MG/2ML IV SOLN: 10.0000 mg | Freq: Once | INTRAVENOUS | Status: DC | PRN

## 2020-03-23 MED ORDER — BUPIVACAINE-EPINEPHRINE 0.25% -1:200000 IJ SOLN
INTRAMUSCULAR | Status: DC | PRN
  Administered 2020-03-23: 10 mL

## 2020-03-23 MED ORDER — LIDOCAINE 2% (20 MG/ML) 5 ML SYRINGE
INTRAMUSCULAR | Status: AC
  Filled 2020-03-23: qty 5

## 2020-03-23 MED ORDER — DEXAMETHASONE SODIUM PHOSPHATE 4 MG/ML IJ SOLN
INTRAMUSCULAR | Status: DC | PRN
  Administered 2020-03-23: 4 mg via INTRAVENOUS

## 2020-03-23 MED ORDER — CHLORHEXIDINE GLUCONATE CLOTH 2 % EX PADS: 6.0000 | MEDICATED_PAD | Freq: Once | CUTANEOUS | Status: DC

## 2020-03-23 MED ORDER — MIDAZOLAM HCL 5 MG/5ML IJ SOLN
INTRAMUSCULAR | Status: DC | PRN
  Administered 2020-03-23: 2 mg via INTRAVENOUS

## 2020-03-23 MED ORDER — ACETAMINOPHEN 500 MG PO TABS
1000.0000 mg | ORAL_TABLET | ORAL | Status: AC
  Administered 2020-03-23: 1000 mg via ORAL

## 2020-03-23 MED ORDER — LIDOCAINE 2% (20 MG/ML) 5 ML SYRINGE
INTRAMUSCULAR | Status: DC | PRN
  Administered 2020-03-23: 60 mg via INTRAVENOUS

## 2020-03-23 MED ORDER — ONDANSETRON HCL 4 MG/2ML IJ SOLN
INTRAMUSCULAR | Status: DC | PRN
  Administered 2020-03-23: 4 mg via INTRAVENOUS

## 2020-03-23 MED ORDER — DEXAMETHASONE SODIUM PHOSPHATE 10 MG/ML IJ SOLN
INTRAMUSCULAR | Status: AC
  Filled 2020-03-23: qty 1

## 2020-03-23 MED ORDER — FENTANYL CITRATE (PF) 100 MCG/2ML IJ SOLN: 25.0000 ug | INTRAMUSCULAR | Status: DC | PRN

## 2020-03-23 MED ORDER — FENTANYL CITRATE (PF) 100 MCG/2ML IJ SOLN
INTRAMUSCULAR | Status: AC
  Filled 2020-03-23: qty 2

## 2020-03-23 MED ORDER — PROPOFOL 10 MG/ML IV BOLUS
INTRAVENOUS | Status: DC | PRN
  Administered 2020-03-23: 200 mg via INTRAVENOUS

## 2020-03-23 MED ORDER — CEFAZOLIN SODIUM-DEXTROSE 2-4 GM/100ML-% IV SOLN
2.0000 g | INTRAVENOUS | Status: AC
  Administered 2020-03-23: 2 g via INTRAVENOUS

## 2020-03-23 MED ORDER — LACTATED RINGERS IV SOLN: INTRAVENOUS | Status: DC

## 2020-03-23 MED ORDER — PROPOFOL 500 MG/50ML IV EMUL
INTRAVENOUS | Status: DC | PRN
  Administered 2020-03-23: 25 ug/kg/min via INTRAVENOUS

## 2020-03-23 MED ORDER — MIDAZOLAM HCL 2 MG/2ML IJ SOLN
INTRAMUSCULAR | Status: AC
  Filled 2020-03-23: qty 2

## 2020-03-23 MED ORDER — FENTANYL CITRATE (PF) 100 MCG/2ML IJ SOLN
INTRAMUSCULAR | Status: DC | PRN
  Administered 2020-03-23: 50 ug via INTRAVENOUS

## 2020-03-23 MED ORDER — PROPOFOL 10 MG/ML IV BOLUS
INTRAVENOUS | Status: AC
  Filled 2020-03-23: qty 20

## 2020-03-23 MED ORDER — ONDANSETRON HCL 4 MG/2ML IJ SOLN
INTRAMUSCULAR | Status: AC
  Filled 2020-03-23: qty 2

## 2020-03-23 MED ORDER — CEFAZOLIN SODIUM-DEXTROSE 2-4 GM/100ML-% IV SOLN
INTRAVENOUS | Status: AC
  Filled 2020-03-23: qty 100

## 2020-03-23 MED ORDER — ACETAMINOPHEN 500 MG PO TABS
ORAL_TABLET | ORAL | Status: AC
  Filled 2020-03-23: qty 2

## 2020-03-23 SURGICAL SUPPLY — 46 items
APL PRP STRL LF DISP 70% ISPRP (MISCELLANEOUS) ×1
APL SKNCLS STERI-STRIP NONHPOA (GAUZE/BANDAGES/DRESSINGS) ×1
APPLIER CLIP 9.375 MED OPEN (MISCELLANEOUS)
APR CLP MED 9.3 20 MLT OPN (MISCELLANEOUS)
BENZOIN TINCTURE PRP APPL 2/3 (GAUZE/BANDAGES/DRESSINGS) ×2 IMPLANT
BLADE HEX COATED 2.75 (ELECTRODE) ×2 IMPLANT
BLADE SURG 15 STRL LF DISP TIS (BLADE) ×1 IMPLANT
BLADE SURG 15 STRL SS (BLADE) ×2
CANISTER SUCT 1200ML W/VALVE (MISCELLANEOUS) ×2 IMPLANT
CHLORAPREP W/TINT 26 (MISCELLANEOUS) ×2 IMPLANT
CLIP APPLIE 9.375 MED OPEN (MISCELLANEOUS) IMPLANT
COVER BACK TABLE 60X90IN (DRAPES) ×2 IMPLANT
COVER MAYO STAND STRL (DRAPES) ×2 IMPLANT
COVER WAND RF STERILE (DRAPES) IMPLANT
DECANTER SPIKE VIAL GLASS SM (MISCELLANEOUS) ×1 IMPLANT
DRAPE LAPAROTOMY 100X72 PEDS (DRAPES) ×2 IMPLANT
DRAPE UTILITY XL STRL (DRAPES) ×2 IMPLANT
DRSG TEGADERM 4X4.75 (GAUZE/BANDAGES/DRESSINGS) ×2 IMPLANT
ELECT REM PT RETURN 9FT ADLT (ELECTROSURGICAL) ×2
ELECTRODE REM PT RTRN 9FT ADLT (ELECTROSURGICAL) ×1 IMPLANT
GAUZE SPONGE 4X4 12PLY STRL LF (GAUZE/BANDAGES/DRESSINGS) IMPLANT
GLOVE SURG ENC MOIS LTX SZ7 (GLOVE) ×2 IMPLANT
GLOVE SURG UNDER POLY LF SZ7.5 (GLOVE) ×2 IMPLANT
GOWN STRL REUS W/ TWL LRG LVL3 (GOWN DISPOSABLE) ×2 IMPLANT
GOWN STRL REUS W/TWL LRG LVL3 (GOWN DISPOSABLE) ×4
ILLUMINATOR WAVEGUIDE N/F (MISCELLANEOUS) IMPLANT
KIT MARKER MARGIN INK (KITS) ×1 IMPLANT
LIGHT WAVEGUIDE WIDE FLAT (MISCELLANEOUS) IMPLANT
NDL HYPO 25X1 1.5 SAFETY (NEEDLE) ×1 IMPLANT
NEEDLE HYPO 25X1 1.5 SAFETY (NEEDLE) ×2 IMPLANT
NS IRRIG 1000ML POUR BTL (IV SOLUTION) ×2 IMPLANT
PACK BASIN DAY SURGERY FS (CUSTOM PROCEDURE TRAY) ×2 IMPLANT
PENCIL SMOKE EVACUATOR (MISCELLANEOUS) ×2 IMPLANT
SLEEVE SCD COMPRESS KNEE MED (STOCKING) ×2 IMPLANT
SPONGE LAP 4X18 RFD (DISPOSABLE) ×2 IMPLANT
STRIP CLOSURE SKIN 1/2X4 (GAUZE/BANDAGES/DRESSINGS) ×2 IMPLANT
SUT CHROMIC 3 0 SH 27 (SUTURE) IMPLANT
SUT MON AB 4-0 PC3 18 (SUTURE) ×2 IMPLANT
SUT SILK 2 0 SH (SUTURE) IMPLANT
SUT VIC AB 3-0 SH 27 (SUTURE) ×2
SUT VIC AB 3-0 SH 27X BRD (SUTURE) ×1 IMPLANT
SYR CONTROL 10ML LL (SYRINGE) ×2 IMPLANT
TOWEL GREEN STERILE FF (TOWEL DISPOSABLE) ×2 IMPLANT
TRAY FAXITRON CT DISP (TRAY / TRAY PROCEDURE) IMPLANT
TUBE CONNECTING 20X1/4 (TUBING) ×2 IMPLANT
YANKAUER SUCT BULB TIP NO VENT (SUCTIONS) ×2 IMPLANT

## 2020-03-23 NOTE — Anesthesia Procedure Notes (Signed)
Procedure Name: LMA Insertion Date/Time: 03/23/2020 9:03 AM Performed by: Maryella Shivers, CRNA Pre-anesthesia Checklist: Patient identified, Emergency Drugs available, Suction available and Patient being monitored Patient Re-evaluated:Patient Re-evaluated prior to induction Oxygen Delivery Method: Circle system utilized Preoxygenation: Pre-oxygenation with 100% oxygen Induction Type: IV induction Ventilation: Mask ventilation without difficulty LMA: LMA inserted LMA Size: 4.0 Number of attempts: 1 Airway Equipment and Method: Bite block Placement Confirmation: positive ETCO2 Tube secured with: Tape Dental Injury: Teeth and Oropharynx as per pre-operative assessment

## 2020-03-23 NOTE — Anesthesia Preprocedure Evaluation (Signed)
Anesthesia Evaluation  Patient identified by MRN, date of birth, ID band Patient awake    Reviewed: Allergy & Precautions, NPO status , Patient's Chart, lab work & pertinent test results  History of Anesthesia Complications (+) PONVNegative for: history of anesthetic complications  Airway Mallampati: III  TM Distance: >3 FB Neck ROM: Full    Dental  (+) Teeth Intact   Pulmonary Current Smoker,    Pulmonary exam normal        Cardiovascular negative cardio ROS Normal cardiovascular exam     Neuro/Psych negative neurological ROS  negative psych ROS   GI/Hepatic negative GI ROS, Neg liver ROS,   Endo/Other  negative endocrine ROS  Renal/GU negative Renal ROS  negative genitourinary   Musculoskeletal negative musculoskeletal ROS (+)   Abdominal   Peds  Hematology negative hematology ROS (+)   Anesthesia Other Findings  Breast cancer  Reproductive/Obstetrics                             Anesthesia Physical  Anesthesia Plan  ASA: II  Anesthesia Plan: General   Post-op Pain Management:    Induction: Intravenous  PONV Risk Score and Plan: 3 and Ondansetron, Dexamethasone, Treatment may vary due to age or medical condition, Propofol infusion and Midazolam  Airway Management Planned: LMA  Additional Equipment: None  Intra-op Plan:   Post-operative Plan: Extubation in OR  Informed Consent: I have reviewed the patients History and Physical, chart, labs and discussed the procedure including the risks, benefits and alternatives for the proposed anesthesia with the patient or authorized representative who has indicated his/her understanding and acceptance.     Dental advisory given  Plan Discussed with:   Anesthesia Plan Comments:        Anesthesia Quick Evaluation

## 2020-03-23 NOTE — H&P (Signed)
Susan Daniel is an 50 y.o. female.   Chief Complaint: Left breast cancer - positive margin on lumpectomy HPI: Referred by Dr. Everlene Daniel for left breast cancer Susan Daniel   This is a 50 year old female with the family history of breast cancer her mother who passed away of breast cancer. She presents after recent screening mammogram. This showed a mass in the left breast. She underwent further workup which revealed a 0.8 x 0.5 x 0.8 cm irregular spiculated mass in the left breast at 6:00 2 cm from the nipple. This area was biopsied and revealed diagnosis of invasive ductal carcinoma ER/PR positive, Ki-67 10%, HER-2 negative, Nottingham grade 2. She presents now for initial surgical evaluation. She has already seen radiation therapy and they are planning radiation if she has breast conserving therapy. She is scheduled see genetics as well as medical oncology next week.  She underwent left lumpectomy on 03/11/20.  The inferior margin was broadly positive, so she presents now for reexcision of this inferior margin.    Past Medical History:  Diagnosis Date  . Allergy   . Arthritis   . Breast cancer ([place]) 02/10/2020   Left  . Complication of anesthesia    constipation  . Family history of breast cancer   . Family history of rectal cancer     Past Surgical History:  Procedure Laterality Date  . BREAST BIOPSY Left 02/10/2020   LEFT  . BREAST LUMPECTOMY WITH RADIOACTIVE SEED AND SENTINEL LYMPH NODE BIOPSY Left 03/11/2020   Procedure: LEFT BREAST LUMPECTOMY WITH RADIOACTIVE SEED AND LEFT AXILLARY SENTINEL LYMPH NODE BIOPSY WITH BLUE DYE INJECTION;  Surgeon: Susan Mesa, MD;  Location: [facility];  Service: General;  Laterality: Left;  . DILATION AND CURETTAGE OF UTERUS    . WISDOM TOOTH EXTRACTION      Family History  Problem Relation Age of Onset  . Heart disease Paternal Grandfather   . Aneurysm Paternal Grandfather   . Diabetes Maternal Grandmother   . Heart disease  Maternal Grandmother   . Breast cancer Mother 54       second diagnosis at age 38  . Kidney disease Maternal Grandfather   . Irritable bowel syndrome Other        Cousins  . Kidney disease Maternal Uncle   . Breast cancer Cousin        dx late 30s/40s; Maternal 1st cousin, daughter of pt's mother's brother  . Rectal cancer Maternal Aunt        dx late 30s/40s   Social History:  reports that she has been smoking cigarettes. She has been smoking about 0.50 packs per day. She has never used smokeless tobacco. She reports current alcohol use. She reports that she does not use drugs.  Allergies:  Allergies  Allergen Reactions  . Codeine Nausea And Vomiting  . Hydrocodone Nausea And Vomiting  . Sulfa Antibiotics Hives    Medications Prior to Admission  Medication Sig Dispense Refill  . levonorgestrel (MIRENA) 20 MCG/24HR IUD 1 each by Intrauterine route once.    . Multiple Vitamin (MULTIVITAMIN WITH MINERALS) TABS tablet Take 1 tablet by mouth 3 (three) times a week.    . traMADol (ULTRAM) 50 MG tablet Take 1 tablet (50 mg total) by mouth every 6 (six) hours as needed for moderate pain or severe pain. 12 tablet 0  . ibuprofen (ADVIL,MOTRIN) 200 MG tablet Take 400 mg by mouth every 6 (six) hours as needed for moderate pain.  Results for orders placed or performed during the hospital encounter of 03/23/20 (from the past 48 hour(s))  Pregnancy, urine POC     Status: None   Collection Time: 03/23/20  8:02 AM  Result Value Ref Range   Preg Test, Ur NEGATIVE NEGATIVE    Comment:        THE SENSITIVITY OF THIS METHODOLOGY IS >24 mIU/mL    No results found.  Review of Systems General Not Present- Appetite Loss, Chills, Fatigue, Fever, Night Sweats, Weight Gain and Weight Loss. Skin Not Present- Change in Wart/Mole, Dryness, Hives, Jaundice, New Lesions, Non-Healing Wounds, Rash and Ulcer. HEENT Present- Seasonal Allergies. Not Present- Earache, Hearing Loss, Hoarseness, Nose Bleed,  Oral Ulcers, Ringing in the Ears, Sinus Pain, Sore Throat, Visual Disturbances, Wears glasses/contact lenses and Yellow Eyes. Respiratory Not Present- Bloody sputum, Chronic Cough, Difficulty Breathing, Snoring and Wheezing. Breast Present- Breast Mass. Not Present- Breast Pain, Nipple Discharge and Skin Changes. Cardiovascular Not Present- Chest Pain, Difficulty Breathing Lying Down, Leg Cramps, Palpitations, Rapid Heart Rate, Shortness of Breath and Swelling of Extremities. Gastrointestinal Not Present- Abdominal Pain, Bloating, Bloody Stool, Change in Bowel Habits, Chronic diarrhea, Constipation, Difficulty Swallowing, Excessive gas, Gets full quickly at meals, Hemorrhoids, Indigestion, Nausea, Rectal Pain and Vomiting. Female Genitourinary Not Present- Frequency, Nocturia, Painful Urination, Pelvic Pain and Urgency. Musculoskeletal Present- Back Pain and Joint Pain. Not Present- Joint Stiffness, Muscle Pain, Muscle Weakness and Swelling of Extremities. Neurological Not Present- Decreased Memory, Fainting, Headaches, Numbness, Seizures, Tingling, Tremor, Trouble walking and Weakness. Psychiatric Not Present- Anxiety, Bipolar, Change in Sleep Pattern, Depression, Fearful and Frequent crying. Endocrine Not Present- Cold Intolerance, Excessive Hunger, Hair Changes, Heat Intolerance, Hot flashes and New Diabetes. Hematology Not Present- Blood Thinners, Easy Bruising, Excessive bleeding, Gland problems, HIV and Persistent Infections.  Blood pressure 113/75, pulse 65, temperature 98.1 F (36.7 C), temperature source Oral, resp. rate 16, height 5' 5.5" (1.664 m), weight 65.9 kg, SpO2 100 %. Physical Exam  Constitutional: WDWN in NAD, conversant, no obvious deformities; resting comfortably Eyes: Pupils equal, round; sclera anicteric; moist conjunctiva; no lid lag HENT: Oral mucosa moist; good dentition Neck: No masses palpated, trachea midline; no thyromegaly Lungs: CTA bilaterally; normal respiratory  effort Breasts: Symmetric, no nipple changes, no axillary lymphadenopathy, bilateral fibrocystic changes, no palpable breast mass on either side. CV: Regular rate and rhythm; no murmurs; extremities well-perfused with no edema Abd: +bowel sounds, soft, non-tender, no palpable organomegaly; no palpable hernias Musc: Normal gait; no apparent clubbing or cyanosis in extremities Lymphatic: No palpable cervical or axillary lymphadenopathy Skin: Warm, dry; no sign of jaundice Psychiatric - alert and oriented x 4; calm mood and affect  Assessment/Plan Left breast invasive ductal carcinoma.  Reexcise inferior margin of lumpectomy.  The surgical procedure has been discussed with the patient.  Potential risks, benefits, alternative treatments, and expected outcomes have been explained.  All of the patient's questions at this time have been answered.  The likelihood of reaching the patient's treatment goal is good.  The patient understand the proposed surgical procedure and wishes to proceed.   Maia Petties, MD 03/23/2020, 8:43 AM

## 2020-03-23 NOTE — Anesthesia Postprocedure Evaluation (Signed)
Anesthesia Post Note  Patient: AMBERLEE GARVEY  Procedure(s) Performed: RE-EXCISION OF INFERIOR MARGIN (Left Breast)     Patient location during evaluation: PACU Anesthesia Type: General Level of consciousness: awake and alert Pain management: pain level controlled Vital Signs Assessment: post-procedure vital signs reviewed and stable Respiratory status: spontaneous breathing, nonlabored ventilation, respiratory function stable and patient connected to nasal cannula oxygen Cardiovascular status: blood pressure returned to baseline and stable Postop Assessment: no apparent nausea or vomiting Anesthetic complications: no   No complications documented.  Last Vitals:  Vitals:   03/23/20 0957 03/23/20 1020  BP: 112/65 119/87  Pulse: 69 60  Resp: 12 12  Temp:  36.5 C  SpO2: 99% 100%    Last Pain:  Vitals:   03/23/20 1020  TempSrc:   PainSc: 0-No pain                 Tiajuana Amass

## 2020-03-23 NOTE — Discharge Instructions (Signed)
[place] Office Phone Number [phone]  BREAST BIOPSY/ PARTIAL MASTECTOMY: POST OP INSTRUCTIONS  Always review your discharge instruction sheet given to you by the facility where your surgery was performed.  IF YOU HAVE DISABILITY OR FAMILY LEAVE FORMS, YOU MUST BRING THEM TO THE OFFICE FOR PROCESSING.  DO NOT GIVE THEM TO YOUR DOCTOR.  1. A prescription for pain medication may be given to you upon discharge.  Take your pain medication as prescribed, if needed.  If narcotic pain medicine is not needed, then you may take acetaminophen (Tylenol) or ibuprofen (Advil) as needed. 2. Take your usually prescribed medications unless otherwise directed 3. If you need a refill on your pain medication, please contact your pharmacy.  They will contact our office to request authorization.  Prescriptions will not be filled after 5pm or on week-ends. 4. You should eat very light the first 24 hours after surgery, such as soup, crackers, pudding, etc.  Resume your normal diet the day after surgery. 5. Most patients will experience some swelling and bruising in the breast.  Ice packs and a good support bra will help.  Swelling and bruising can take several days to resolve.  6. It is common to experience some constipation if taking pain medication after surgery.  Increasing fluid intake and taking a stool softener will usually help or prevent this problem from occurring.  A mild laxative (Milk of Magnesia or Miralax) should be taken according to package directions if there are no bowel movements after 48 hours. 7. Unless discharge instructions indicate otherwise, you may remove your bandages 24-48 hours after surgery, and you may shower at that time.  You may have steri-strips (small skin tapes) in place directly over the incision.  These strips should be left on the skin for 7-10 days.  If your surgeon used skin glue on the incision, you may shower in 24 hours.  The glue will flake off over the  next 2-3 weeks.  Any sutures or staples will be removed at the office during your follow-up visit. 8. ACTIVITIES:  You may resume regular daily activities (gradually increasing) beginning the next day.  Wearing a good support bra or sports bra minimizes pain and swelling.  You may have sexual intercourse when it is comfortable. a. You may drive when you no longer are taking prescription pain medication, you can comfortably wear a seatbelt, and you can safely maneuver your car and apply brakes. b. RETURN TO WORK:  ______________________________________________________________________________________ 9. You should see your doctor in the office for a follow-up appointment approximately two weeks after your surgery.  Your doctor's nurse will typically make your follow-up appointment when she calls you with your pathology report.  Expect your pathology report 2-3 business days after your surgery.  You may call to check if you do not hear from [place] after three days. 10. OTHER INSTRUCTIONS: _______________________________________________________________________________________________ _____________________________________________________________________________________________________________________________________ _____________________________________________________________________________________________________________________________________ _____________________________________________________________________________________________________________________________________  WHEN TO CALL YOUR DOCTOR: 1. Fever over 101.0 2. Nausea and/or vomiting. 3. Extreme swelling or bruising. 4. Continued bleeding from incision. 5. Increased pain, redness, or drainage from the incision.  The clinic staff is available to answer your questions during regular business hours.  Please don't hesitate to call and ask to speak to one of the nurses for clinical concerns.  If you have a medical emergency, go to the nearest  emergency room or call 911.  A surgeon from [facility] is always on call at the hospital.  For further questions, please visit [email or web address]  May take Tylenol after 2pm, if needed.    Post Anesthesia Home Care Instructions  Activity: Get plenty of rest for the remainder of the day. A responsible individual must stay with you for 24 hours following the procedure.  For the next 24 hours, DO NOT: -Drive a car -Paediatric nurse -Drink alcoholic beverages -Take any medication unless instructed by your physician -Make any legal decisions or sign important papers.  Meals: Start with liquid foods such as gelatin or soup. Progress to regular foods as tolerated. Avoid greasy, spicy, heavy foods. If nausea and/or vomiting occur, drink only clear liquids until the nausea and/or vomiting subsides. Call your physician if vomiting continues.  Special Instructions/Symptoms: Your throat may feel dry or sore from the anesthesia or the breathing tube placed in your throat during surgery. If this causes discomfort, gargle with warm salt water. The discomfort should disappear within 24 hours.  If you had a scopolamine patch placed behind your ear for the management of post- operative nausea and/or vomiting:  1. The medication in the patch is effective for 72 hours, after which it should be removed.  Wrap patch in a tissue and discard in the trash. Wash hands thoroughly with soap and water. 2. You may remove the patch earlier than 72 hours if you experience unpleasant side effects which may include dry mouth, dizziness or visual disturbances. 3. Avoid touching the patch. Wash your hands with soap and water after contact with the patch.

## 2020-03-23 NOTE — Transfer of Care (Signed)
Immediate Anesthesia Transfer of Care Note  Patient: Susan Daniel  Procedure(s) Performed: RE-EXCISION OF INFERIOR MARGIN (Left Breast)  Patient Location: PACU  Anesthesia Type:General  Level of Consciousness: sedated  Airway & Oxygen Therapy: Patient Spontanous Breathing and Patient connected to face mask oxygen  Post-op Assessment: Report given to RN and Post -op Vital signs reviewed and stable  Post vital signs: Reviewed and stable  Last Vitals:  Vitals Value Taken Time  BP 98/47 03/23/20 0941  Temp    Pulse 68 03/23/20 0943  Resp 10 03/23/20 0943  SpO2 100 % 03/23/20 0943  Vitals shown include unvalidated device data.  Last Pain:  Vitals:   03/23/20 0819  TempSrc: Oral  PainSc: 8       Patients Stated Pain Goal: 3 (70/[id number]/95 [id number])  Complications: No complications documented.

## 2020-03-23 NOTE — Op Note (Signed)
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  She had invasive ductal carcinoma.  She underwent lumpectomy on 03/11/2020 as well as a sentinel lymph node biopsy.  The sentinel lymph nodes were negative.  The inferior margin was positive so she presents now for reexcision.  Description of procedure: The patient is brought to the operating room and placed in the supine position on the operating room table.  After an adequate level of general anesthesia was obtained, her left breast was prepped with ChloraPrep and draped in sterile fashion.  Timeout was taken to ensure the proper patient and proper procedure.  We infiltrated the area around the nipple with 0.25% Marcaine with epinephrine.  I opened her previous incision.  We evacuated the seroma.  I excised another centimeter thickness of the inferior margin.  We oriented this margin with the paint kit.  This was sent for pathologic examination.  The wound was inspected for hemostasis.  We closed the wound with a deep layer of 3-0 Vicryl and a subcuticular layer 4-0 Monocryl.  Benzoin and Steri-Strips were applied.  The patient was incidentally noted to have a protruding seroma in her axilla.  There is no sign of infection.  We prepped with ChloraPrep and I aspirated 80 cc of clear seroma fluid with decompression of the palpable mass.  She was extubated and brought to the recovery room in stable condition.  All sponge, instrument, and needle counts are correct.  [name]. Georgette Dover, MD, [facility]  General/ Trauma Surgery   03/23/2020 9:36 AM

## 2020-03-25 ENCOUNTER — Encounter: Payer: Self-pay | Admitting: *Deleted

## 2020-03-25 ENCOUNTER — Telehealth: Payer: Self-pay | Admitting: *Deleted

## 2020-03-25 ENCOUNTER — Encounter (HOSPITAL_BASED_OUTPATIENT_CLINIC_OR_DEPARTMENT_OTHER): Payer: Self-pay | Admitting: Surgery

## 2020-03-25 DIAGNOSIS — C50512 Malignant neoplasm of lower-outer quadrant of left female breast: Secondary | ICD-10-CM

## 2020-03-25 DIAGNOSIS — Z170 Estrogen receptor positive status [ER+]: Secondary | ICD-10-CM

## 2020-03-25 LAB — SURGICAL PATHOLOGY

## 2020-03-25 NOTE — Telephone Encounter (Signed)
Pt called with some limited ROM of left arm after sx. Referral placed to PT Pt notified.

## 2020-03-30 ENCOUNTER — Encounter: Payer: Self-pay | Admitting: *Deleted

## 2020-04-05 ENCOUNTER — Encounter (HOSPITAL_BASED_OUTPATIENT_CLINIC_OR_DEPARTMENT_OTHER): Payer: Self-pay | Admitting: Surgery

## 2020-04-06 ENCOUNTER — Other Ambulatory Visit: Payer: Self-pay

## 2020-04-06 ENCOUNTER — Ambulatory Visit: Payer: No Typology Code available for payment source | Attending: Oncology

## 2020-04-06 DIAGNOSIS — M25512 Pain in left shoulder: Secondary | ICD-10-CM | POA: Diagnosis present

## 2020-04-06 DIAGNOSIS — C50912 Malignant neoplasm of unspecified site of left female breast: Secondary | ICD-10-CM | POA: Insufficient documentation

## 2020-04-06 DIAGNOSIS — Z483 Aftercare following surgery for neoplasm: Secondary | ICD-10-CM | POA: Insufficient documentation

## 2020-04-06 DIAGNOSIS — R293 Abnormal posture: Secondary | ICD-10-CM | POA: Diagnosis present

## 2020-04-06 NOTE — Therapy (Signed)
[place], [place], [address] Phone: [phone]   Fax:  [phone]  Physical Therapy Evaluation  Patient Details  Name: Susan Daniel MRN: [id number] Date of Birth: 1970/07/18 Referring Provider (PT): Dr. Georgette Dover   Encounter Date: 04/06/2020   PT End of Session - 04/06/20 1725    Visit Number 1    Number of Visits 12    Date for PT Re-Evaluation 05/18/20    PT Start Time 1503    PT Stop Time 1550    PT Time Calculation (min) 47 min    Activity Tolerance Patient tolerated treatment well    Behavior During Therapy [facility] for tasks assessed/performed           Past Medical History:  Diagnosis Date  . Allergy   . Arthritis   . Breast cancer ([place]) 02/10/2020   Left  . Complication of anesthesia    constipation  . Family history of breast cancer   . Family history of rectal cancer     Past Surgical History:  Procedure Laterality Date  . BREAST BIOPSY Left 02/10/2020   LEFT  . BREAST LUMPECTOMY WITH RADIOACTIVE SEED AND SENTINEL LYMPH NODE BIOPSY Left 03/11/2020   Procedure: LEFT BREAST LUMPECTOMY WITH RADIOACTIVE SEED AND LEFT AXILLARY SENTINEL LYMPH NODE BIOPSY WITH BLUE DYE INJECTION;  Surgeon: Donnie Mesa, MD;  Location: [place];  Service: General;  Laterality: Left;  . DILATION AND CURETTAGE OF UTERUS    . RE-EXCISION OF BREAST LUMPECTOMY Left 03/23/2020   Procedure: RE-EXCISION OF INFERIOR MARGIN;  Surgeon: Donnie Mesa, MD;  Location: [place];  Service: General;  Laterality: Left;  . WISDOM TOOTH EXTRACTION      There were no vitals filed for this visit.    Subjective Assessment - 04/06/20 1501    Subjective Have not  had too much pain in breast but axillary region was swollen and painful.  Had a seroma drained that was present after first surgery and was drained on March 8th.  Feels like more swelling has started, but it isn't as bad as it was.  Pt has pain that goes  down medial side of arm and arm is tender. She doesn't feel or notice any arm swelling.  Had 3 LN's removed    Pertinent History Pt is s/p left Lumpectomy with SLNB on 03/11/2020 for Invasive Ductal Carcinoma.  She had to have a re-excision performed on 03/23/2020.  ON 03/23/2020 she had a seroma drained that had occured after the first surgery.  She will have to have radiation at some point.    Patient Stated Goals Try to decrease swelling, get full mobility in arm.    Currently in Pain? Yes    Pain Score 6     Pain Location Arm    Pain Orientation Left    Pain Descriptors / Indicators Tender;Sore;Tightness    Pain Type Surgical pain    Pain Onset 1 to 4 weeks ago    Pain Frequency Constant    Aggravating Factors  touching it, reaching to extremes,    Pain Relieving Factors ice    Effect of Pain on Daily Activities limited use of left arm    Multiple Pain Sites No              [facility] PT Assessment - 04/06/20 0001      Assessment   Referring Provider (PT) Dr. Jana Hakim   Onset Date/Surgical Date 03/11/20    Hand  Dominance Right    Prior Therapy yes   back pain     Precautions   Precaution Comments lymphedema risk      Restrictions   Weight Bearing Restrictions No      Balance Screen   Has the patient fallen in the past 6 months Yes    How many times? 1    Has the patient had a decrease in activity level because of a fear of falling?  No    Is the patient reluctant to leave their home because of a fear of falling?  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    Leisure plays with daughter, dog, walk in country      Cognition   Overall Cognitive Status Within Functional Limits for tasks assessed      AROM   Right  Shoulder Extension 62 Degrees    Right Shoulder Flexion 162 Degrees    Right Shoulder ABduction 180 Degrees    Right Shoulder Internal Rotation 73 Degrees    Right Shoulder External Rotation 105 Degrees    Left Shoulder Extension 57 Degrees    Left Shoulder Flexion 127 Degrees    Left Shoulder ABduction 97 Degrees             LYMPHEDEMA/ONCOLOGY QUESTIONNAIRE - 04/06/20 0001      Surgeries   Lumpectomy Date 03/11/20    Other Surgery Date 03/23/20    Number Lymph Nodes Removed 3      Treatment   Active Chemotherapy Treatment No    Past Chemotherapy Treatment No    Active Radiation Treatment --   will have   Past Radiation Treatment No    Current Hormone Treatment Yes    Past Hormone Therapy No      What other symptoms do you have   Are you Having Heaviness or Tightness Yes    Are you having Pain Yes    Are you having pitting edema No    Is it Hard or Difficult finding clothes that fit No    Do you have infections No      Right Upper Extremity Lymphedema   10 cm Proximal to Olecranon Process 27 cm    Olecranon Process 25 cm    10 cm Proximal to Ulnar Styloid Process 20.3 cm    Just Proximal to Ulnar Styloid Process 6.1 cm    At Base of 2nd Digit 6.3 cm      Left Upper Extremity Lymphedema   10 cm Proximal to Olecranon Process 27.1 cm    Olecranon Process 24.8 cm    10 cm Proximal to Ulnar Styloid Process 19.3 cm    Just Proximal to Ulnar Styloid Process 16.2 cm    At Base of 2nd Digit 6.3 cm                 Quick Dash - 04/06/20 0001    Open a tight or new jar Moderate difficulty    Do heavy household chores (wash walls, wash floors) Moderate difficulty    Carry a shopping bag or briefcase No difficulty    Wash your back Mild difficulty    Use a knife to cut food No  difficulty    Recreational activities in which you take some force or impact through your arm, shoulder, or hand (golf, hammering, tennis) Unable    During the past week, to what extent has  your arm, shoulder or hand problem interfered with your normal social activities with family, friends, neighbors, or groups? Quite a bit    During the past week, to what extent has your arm, shoulder or hand problem limited your work or other regular daily activities Quite a bit    Arm, shoulder, or hand pain. Moderate    Tingling (pins and needles) in your arm, shoulder, or hand None    Difficulty Sleeping Mild difficulty    DASH Score 40.91 %            Objective measurements completed on examination: See above findings.               PT Education - 04/06/20 1540    Education Details Pt was educated in 4 post op exercises with flexion and stargazer in supine., She was educated in importance of ABC class, and was instructed in precautions for prevention of Lymphedema.    Person(s) Educated Patient    Methods Explanation;Demonstration;Handout    Comprehension Verbalized understanding;Returned demonstration               PT Long Term Goals - 04/06/20 1735      PT LONG TERM GOAL #1   Title Pt will be independent and compliant with HEP for shoulder ROM/strength    Time 4    Period Weeks    Status New    Target Date 05/04/20      PT LONG TERM GOAL #2   Title Pt will have left shoulder AROM WNL for improved reaching    Time 6    Period Weeks    Status New    Target Date 05/18/20      PT LONG TERM GOAL #3   Title Pt will have decreased pain by atleast 50%    Time 6    Period Weeks    Status New    Target Date 05/18/20      PT LONG TERM GOAL #4   Title Pt will be independent with self MLD prn    Time 5    Period Weeks    Status New    Target Date 05/11/20      PT LONG TERM GOAL #5   Title Quick dash will be no greater than 15% to demonstrate improved function    Baseline 41%    Time 6    Period Weeks    Status New    Target Date 05/18/20                  Plan - 04/06/20 1726    Clinical Impression Statement Pt is s/p left lumpectomy with  SLNB on 03/11/2020 with re-excision to get clear margins on 03/23/2020.  She had a seroma that developed after first surgery drained on 03/23/2020.  She presents with limitations of left shoulder A/PROM, multiple cords in the axillary and upper arm region with pain and tenderness, and slight inferior axillary swelling.  She will benefit from [facility] techniques to areas of cording, PROM, and MLD and instruction in self MLD.  We discussed benefits of a sports bra that opens in the front or a compression bra.  She will start with a sports bra. She may benefit from a chip pack with the proper bra.  Stability/Clinical Decision Making Stable/Uncomplicated    Clinical Decision Making Low    Rehab Potential Excellent    PT Frequency 2x / week    PT Duration 6 weeks    PT Treatment/Interventions ADLs/Self Care Home Management;Therapeutic exercise;Manual techniques;Patient/family education;Manual lymph drainage;Passive range of motion;Vasopneumatic Device;Taping    PT Next Visit Plan MFR to left axillary upper arm cording, PROM left shoulder, STM to scar areas and pectoralis muscles etc, chip pack prn.  progress to strength    PT Home Exercise Plan 4 post op exercises, ABC class    Recommended Other Services compression sleeve, bra?    Consulted and Agree with Plan of Care Patient           Patient will benefit from skilled therapeutic intervention in order to improve the following deficits and impairments:  Decreased activity tolerance,Decreased knowledge of precautions,Decreased range of motion,Decreased scar mobility,Increased edema,Increased fascial restricitons,Postural dysfunction,Impaired UE functional use,Pain  Visit Diagnosis: Invasive ductal carcinoma of breast, female, left ([place])  Abnormal posture  Acute pain of left shoulder  Aftercare following surgery for neoplasm     Problem List Patient Active Problem List   Diagnosis Date Noted  . Genetic testing 03/04/2020  . Tobacco abuse  02/26/2020  . Family history of breast cancer   . Family history of rectal cancer   . Malignant neoplasm of lower-outer quadrant of left breast of female, estrogen receptor positive ([place]) 02/13/2020    Claris Pong 04/06/2020, 5:38 PM  [place], [place], [address] Phone: [phone]   Fax:  [phone]  Name: DARINDA STUTEVILLE MRN: [id number] Date of Birth: [birth year] Cheral Almas, PT 04/06/20 5:43 PM

## 2020-04-06 NOTE — Patient Instructions (Addendum)
Physical Therapy Information for After Breast Cancer Surgery/Treatment:   Lymphedema is a swelling condition that you may be at risk for in your arm if you have lymph nodes removed from the armpit area.  After a sentinel node biopsy, the risk is approximately 5-9% and is higher after an axillary node dissection.  There is treatment available for this condition and it is not life-threatening.  Contact your physician or physical therapist with concerns.  You may begin the 4 shoulder/posture exercises (see additional sheet) when permitted by your physician (typically a week after surgery).  If you have drains, you may need to wait until those are removed before beginning range of motion exercises.  A general recommendation is to not lift your arms above shoulder height until drains are removed.  These exercises should be done to your tolerance and gently.  This is not a "no pain/no gain" type of recovery so listen to your body and stretch into the range of motion that you can tolerate, stopping if you have pain.  If you are having immediate reconstruction, ask your plastic surgeon about doing exercises as he or she may want you to wait.  We encourage you to attend the free one time ABC (After Breast Cancer) class offered by [facility].  You will learn information related to lymphedema risk, prevention and treatment and additional exercises to regain mobility following surgery.  You can call [phone] for more information.  This is offered the 1st and 3rd Monday of each month.  You only attend the class one time.  While undergoing any medical procedure or treatment, try to avoid blood pressure being taken or needle sticks from occurring on the arm on the side of cancer.   This recommendation begins after surgery and continues for the rest of your life.  This may help reduce your risk of getting lymphedema (swelling in your arm).  An excellent resource for those seeking information  on lymphedema is the National Lymphedema Network's web site. It can be accessed at [facility] [place].org  If you notice swelling in your hand, arm or breast at any time following surgery (even if it is many years from now), please contact your doctor or physical therapist to discuss this.  Lymphedema can be treated at any time but it is easier for you if it is treated early on.  If you feel like your shoulder motion is not returning to normal in a reasonable amount of time, please contact your surgeon or physical therapist.  [facility]. [place], [place], [place] [phone]; [address] [place], [place] [address] ABC CLASS After Breast Cancer Class  After Breast Cancer Class is a specially designed exercise class to assist you in a safe recover after having breast cancer surgery.  In this class you will learn how to get back to full function whether your drains were just removed or if you had surgery a month ago.  This one-time class is held the 1st and 3rd Monday of every month virtually from 11:00 a.m. until 12:00 noon   This class is FREE and space is limited. For more information or to register for the next available class, call [phone].  Class Goals   Understand specific stretches to improve the flexibility of you chest and shoulder.  Learn ways to safely strengthen your upper body and improve your posture.  Understand the warning signs of infection and why you may be at risk for an arm infection.  Learn about Lymphedema  and prevention.  ** You do not attend this class until after surgery.  Drains must be removed to participate  Patient was instructed today in a home exercise program today for post op shoulder range of motion. These included active assist shoulder flexion in sitting, scapular retraction, wall walking with shoulder abduction, and hands behind head external rotation.  She was encouraged to do these twice a day, holding 3 seconds and repeating 5 times    Axillary web  syndrome (also called cording) can happen after having breast cancer surgery when lymph nodes in the armpit are removed. It presents as if you have a thin cord in your arm and can run from the armpit all the way down into the forearm. If you've had a sentinel node biopsy, the risk is 1-20% and if you've had an axillary lymph node dissection (more than 7 nodes removed), the risk is 36-72%. The ranges vary depending on the research study.  It most often happens 3-4 weeks post-op but can happen sooner or later. There are several possibilities for what cording actually is. Although no one knows for sure as of yet, it may be related to lymphatics, veins, or other tissue. Sometimes cording resolves on its own but other times it requires physical therapy with a therapist who specializes in lymphedema and/or cancer rehab. Treatment typically involves stretching, manual techniques, and exercise. Sometimes cords get "released" while stretching or during manual treatment and the patient may experience the sensation of a "pop." This may feel strange but it is not dangerous and is a sign that the cord has released; range of motion may be improved in the process.

## 2020-04-08 ENCOUNTER — Encounter: Payer: Self-pay | Admitting: *Deleted

## 2020-04-12 ENCOUNTER — Encounter: Payer: Self-pay | Admitting: *Deleted

## 2020-04-13 ENCOUNTER — Ambulatory Visit: Payer: No Typology Code available for payment source

## 2020-04-15 ENCOUNTER — Ambulatory Visit: Payer: No Typology Code available for payment source | Admitting: Rehabilitation

## 2020-04-15 ENCOUNTER — Encounter: Payer: Self-pay | Admitting: *Deleted

## 2020-04-16 ENCOUNTER — Encounter: Payer: Self-pay | Admitting: *Deleted

## 2020-04-16 ENCOUNTER — Telehealth: Payer: Self-pay | Admitting: *Deleted

## 2020-04-16 DIAGNOSIS — Z170 Estrogen receptor positive status [ER+]: Secondary | ICD-10-CM

## 2020-04-16 DIAGNOSIS — C50512 Malignant neoplasm of lower-outer quadrant of left female breast: Secondary | ICD-10-CM

## 2020-04-16 NOTE — Telephone Encounter (Signed)
Spoke to pt Environmental health practitioner. Received verbal authorizeation [email or web address]/1/[email or web address] for oncotype testing results.  Called [facility], provided with authorization information as well as contact information for pre-auth specialist [facility] whom provided verbal authorization.

## 2020-04-16 NOTE — Telephone Encounter (Signed)
Received oncotype score of 20. Physician team notified. Called pt to discuss results. Informed chemo not recommended based on these results. Informed next step is xrt with Dr. Lisbeth Renshaw. Received verbal understanding. Referral placed.

## 2020-04-20 ENCOUNTER — Encounter: Payer: Self-pay | Admitting: *Deleted

## 2020-04-20 ENCOUNTER — Encounter: Payer: Self-pay | Admitting: Rehabilitation

## 2020-04-20 ENCOUNTER — Ambulatory Visit: Payer: No Typology Code available for payment source | Attending: Oncology | Admitting: Rehabilitation

## 2020-04-20 ENCOUNTER — Other Ambulatory Visit: Payer: Self-pay

## 2020-04-20 DIAGNOSIS — M25512 Pain in left shoulder: Secondary | ICD-10-CM | POA: Insufficient documentation

## 2020-04-20 DIAGNOSIS — R293 Abnormal posture: Secondary | ICD-10-CM | POA: Diagnosis not present

## 2020-04-20 DIAGNOSIS — C50912 Malignant neoplasm of unspecified site of left female breast: Secondary | ICD-10-CM | POA: Insufficient documentation

## 2020-04-20 DIAGNOSIS — Z483 Aftercare following surgery for neoplasm: Secondary | ICD-10-CM | POA: Insufficient documentation

## 2020-04-20 NOTE — Therapy (Signed)
[place], [place], [address] Phone: [phone]   Fax:  [phone]  Physical Therapy Treatment  Patient Details  Name: Susan Daniel MRN: [id number] Date of Birth: [birth year] Referring Provider (PT): Dr. Jana Hakim   Encounter Date: 04/20/2020   PT End of Session - 04/20/20 1554    Visit Number 2    Number of Visits 12    Date for PT Re-Evaluation 05/18/20    PT Start Time 1504    PT Stop Time 1550    PT Time Calculation (min) 46 min    Activity Tolerance Patient tolerated treatment well    Behavior During Therapy [facility] for tasks assessed/performed           Past Medical History:  Diagnosis Date  . Allergy   . Arthritis   . Breast cancer ([place]) 02/10/2020   Left  . Complication of anesthesia    constipation  . Family history of breast cancer   . Family history of rectal cancer     Past Surgical History:  Procedure Laterality Date  . BREAST BIOPSY Left 02/10/2020   LEFT  . BREAST LUMPECTOMY WITH RADIOACTIVE SEED AND SENTINEL LYMPH NODE BIOPSY Left 03/11/2020   Procedure: LEFT BREAST LUMPECTOMY WITH RADIOACTIVE SEED AND LEFT AXILLARY SENTINEL LYMPH NODE BIOPSY WITH BLUE DYE INJECTION;  Surgeon: Donnie Mesa, MD;  Location: [place];  Service: General;  Laterality: Left;  . DILATION AND CURETTAGE OF UTERUS    . RE-EXCISION OF BREAST LUMPECTOMY Left 03/23/2020   Procedure: RE-EXCISION OF INFERIOR MARGIN;  Surgeon: Donnie Mesa, MD;  Location: [place];  Service: General;  Laterality: Left;  . WISDOM TOOTH EXTRACTION      There were no vitals filed for this visit.   Subjective Assessment - 04/20/20 1504    Subjective We had a death in the family last week.    Pertinent History Pt is s/p left Lumpectomy with SLNB on 03/11/2020 for Invasive Ductal Carcinoma.  She had to have a re-excision performed on 03/23/2020.  ON 03/23/2020 she had a seroma drained that had occured after the  first surgery.  She will have to have radiation at some point.    Patient Stated Goals Try to decrease swelling, get full mobility in arm.    Currently in Pain? No/denies                             [facility] Adult PT Treatment/Exercise - 04/20/20 0001      Exercises   Exercises Shoulder      Shoulder Exercises: Stretch   Other Shoulder Stretches gave pt handout on median nerve flossing and prolonged stretch as well as single arm wall chest stretch      Manual Therapy   Manual Therapy Passive ROM;Soft tissue mobilization;Manual Lymphatic Drainage (MLD);Myofascial release;Edema management    Edema Management pt with new sports bra with good coverage but not alot of compression.  Added foam rectangle and gave prescription for bras    Soft tissue mobilization to the left forearm, upperarm, and axilla following cording in horizontal abduction and overhead position    Myofascial Release to the left axilla and arm    Manual Lymphatic Drainage (MLD) post MFR to the arm distal to proximal    Passive ROM to the left shoulder into flexion, abduction, horizontal abduction,  PT Long Term Goals - 04/06/20 1735      PT LONG TERM GOAL #1   Title Pt will be independent and compliant with HEP for shoulder ROM/strength    Time 4    Period Weeks    Status New    Target Date 05/04/20      PT LONG TERM GOAL #2   Title Pt will have left shoulder AROM WNL for improved reaching    Time 6    Period Weeks    Status New    Target Date 05/18/20      PT LONG TERM GOAL #3   Title Pt will have decreased pain by atleast 50%    Time 6    Period Weeks    Status New    Target Date 05/18/20      PT LONG TERM GOAL #4   Title Pt will be independent with self MLD prn    Time 5    Period Weeks    Status New    Target Date 05/11/20      PT LONG TERM GOAL #5   Title Quick dash will be no greater than 15% to demonstrate improved function    Baseline 41%     Time 6    Period Weeks    Status New    Target Date 05/18/20                 Plan - 04/20/20 1554    Clinical Impression Statement Started cording release today to the left arm tolerated well.  1-3 cords evident throughout the arm from axilla to mid forearm.  Pt had received bra but it does not seem to provide much compression so she was given instruction on second to nature and prescription as well as foam insert.    PT Frequency 2x / week    PT Duration 6 weeks    PT Treatment/Interventions ADLs/Self Care Home Management;Therapeutic exercise;Manual techniques;Patient/family education;Manual lymph drainage;Passive range of motion;Vasopneumatic Device;Taping    PT Next Visit Plan MFR to left axillary upper arm cording, PROM left shoulder, STM to scar areas and pectoralis muscles etc, chip pack prn.  progress to strength    PT Home Exercise Plan 4 post op exercises, ABC class, Access Code: [email or web address]    Consulted and Agree with Plan of Care Patient           Patient will benefit from skilled therapeutic intervention in order to improve the following deficits and impairments:  Decreased activity tolerance,Decreased knowledge of precautions,Decreased range of motion,Decreased scar mobility,Increased edema,Increased fascial restricitons,Postural dysfunction,Impaired UE functional use,Pain  Visit Diagnosis: Abnormal posture  Acute pain of left shoulder  Invasive ductal carcinoma of breast, female, left [facility])  Aftercare following surgery for neoplasm     Problem List Patient Active Problem List   Diagnosis Date Noted  . Genetic testing 03/04/2020  . Tobacco abuse 02/26/2020  . Family history of breast cancer   . Family history of rectal cancer   . Malignant neoplasm of lower-outer quadrant of left breast of female, estrogen receptor positive ([place]) 02/13/2020    Stark Bray 04/20/2020, 3:56 PM  [place], [place], [address] Phone: [phone]   Fax:  [phone]  Name: Susan Daniel MRN: [id number] Date of Birth: 09/09/1970

## 2020-04-20 NOTE — Patient Instructions (Signed)
Access Code: [email or web address]  URL: [email or web address]: 04/05/2022Prepared by: Marcene Brawn TevisExercises  Standing Median Nerve Glide - 7 x weekly - 10 reps - no hold  Standing Pec Stretch at Wall - 7 x weekly - 10 reps - no hold  Single Arm Doorway Pec Stretch at 120 Degrees Abduction - 7 x weekly - 1 sets - 3 reps - 20-30 seconds hold

## 2020-04-22 ENCOUNTER — Other Ambulatory Visit: Payer: Self-pay

## 2020-04-22 ENCOUNTER — Ambulatory Visit: Payer: No Typology Code available for payment source

## 2020-04-22 DIAGNOSIS — Z483 Aftercare following surgery for neoplasm: Secondary | ICD-10-CM

## 2020-04-22 DIAGNOSIS — R293 Abnormal posture: Secondary | ICD-10-CM

## 2020-04-22 DIAGNOSIS — C50912 Malignant neoplasm of unspecified site of left female breast: Secondary | ICD-10-CM

## 2020-04-22 DIAGNOSIS — M25512 Pain in left shoulder: Secondary | ICD-10-CM

## 2020-04-22 NOTE — Therapy (Signed)
[place], [place], [address] Phone: [phone]   Fax:  [phone]  Physical Therapy Treatment  Patient Details  Name: Susan Daniel MRN: [id number] Date of Birth: July 27, 1970 Referring Provider (PT): Dr. Jana Hakim   Encounter Date: 04/22/2020   PT End of Session - 04/22/20 1315    Visit Number 3    Number of Visits 12    Date for PT Re-Evaluation 05/18/20    PT Start Time 1307    PT Stop Time 1353    PT Time Calculation (min) 46 min    Activity Tolerance Patient tolerated treatment well    Behavior During Therapy [facility] for tasks assessed/performed           Past Medical History:  Diagnosis Date  . Allergy   . Arthritis   . Breast cancer ([place]) 02/10/2020   Left  . Complication of anesthesia    constipation  . Family history of breast cancer   . Family history of rectal cancer     Past Surgical History:  Procedure Laterality Date  . BREAST BIOPSY Left 02/10/2020   LEFT  . BREAST LUMPECTOMY WITH RADIOACTIVE SEED AND SENTINEL LYMPH NODE BIOPSY Left 03/11/2020   Procedure: LEFT BREAST LUMPECTOMY WITH RADIOACTIVE SEED AND LEFT AXILLARY SENTINEL LYMPH NODE BIOPSY WITH BLUE DYE INJECTION;  Surgeon: Donnie Mesa, MD;  Location: [place];  Service: General;  Laterality: Left;  . DILATION AND CURETTAGE OF UTERUS    . RE-EXCISION OF BREAST LUMPECTOMY Left 03/23/2020   Procedure: RE-EXCISION OF INFERIOR MARGIN;  Surgeon: Donnie Mesa, MD;  Location: [place];  Service: General;  Laterality: Left;  . WISDOM TOOTH EXTRACTION      There were no vitals filed for this visit.   Subjective Assessment - 04/22/20 1307    Subjective Just a little sore yesterday but not bad. I wore a different bra today but I dont think its any better.  I do think the swelling is better than when I saw you last.    Pertinent History Pt is s/p left Lumpectomy with SLNB on 03/11/2020 for Invasive Ductal  Carcinoma.  She had to have a re-excision performed on 03/23/2020.  ON 03/23/2020 she had a seroma drained that had occured after the first surgery.  She will have to have radiation at some point.    Patient Stated Goals Try to decrease swelling, get full mobility in arm.    Currently in Pain? No/denies    Pain Score 0-No pain                             [facility] Adult PT Treatment/Exercise - 04/22/20 0001      Shoulder Exercises: Pulleys   Flexion 2 minutes    ABduction 2 minutes      Shoulder Exercises: Therapy Ball   Flexion 10 reps      Manual Therapy   Manual Therapy Soft tissue mobilization;Myofascial release;Passive ROM    Soft tissue mobilization to the left forearm, upperarm, and axilla following cording in horizontal abduction and overhead position, and to axillary incision    Myofascial Release to the left axilla and arm    Passive ROM to the left shoulder into flexion, abduction, horizontal abduction,                       PT Long Term Goals - 04/06/20 1735  PT LONG TERM GOAL #1   Title Pt will be independent and compliant with HEP for shoulder ROM/strength    Time 4    Period Weeks    Status New    Target Date 05/04/20      PT LONG TERM GOAL #2   Title Pt will have left shoulder AROM WNL for improved reaching    Time 6    Period Weeks    Status New    Target Date 05/18/20      PT LONG TERM GOAL #3   Title Pt will have decreased pain by atleast 50%    Time 6    Period Weeks    Status New    Target Date 05/18/20      PT LONG TERM GOAL #4   Title Pt will be independent with self MLD prn    Time 5    Period Weeks    Status New    Target Date 05/11/20      PT LONG TERM GOAL #5   Title Quick dash will be no greater than 15% to demonstrate improved function    Baseline 41%    Time 6    Period Weeks    Status New    Target Date 05/18/20                 Plan - 04/22/20 1346    Clinical Impression Statement Pt  continues with multiple cords in the left axilla radiating to the forearm which restrictcts her AROM.Marland Kitchen  She wore a different bra today which is good with coverage but does not provide much compression. She is going to [facility] app[facility].  Fibrosis noted at incision around areola. ROM is improving but still limited    Stability/Clinical Decision Making Stable/Uncomplicated    Rehab Potential Excellent    PT Frequency 2x / week    PT Duration 6 weeks    PT Treatment/Interventions ADLs/Self Care Home Management;Therapeutic exercise;Manual techniques;Patient/family education;Manual lymph drainage;Passive range of motion;Vasopneumatic Device;Taping    PT Next Visit Plan MFR to left axillary upper arm cording, PROM left shoulder, STM to scar areas and pectoralis muscles etc, chip pack prn.  progress to strength    PT Home Exercise Plan 4 post op exercises, ABC class, Access Code: [email or web address]    Consulted and Agree with Plan of Care Patient           Patient will benefit from skilled therapeutic intervention in order to improve the following deficits and impairments:  Decreased activity tolerance,Decreased knowledge of precautions,Decreased range of motion,Decreased scar mobility,Increased edema,Increased fascial restricitons,Postural dysfunction,Impaired UE functional use,Pain  Visit Diagnosis: Abnormal posture  Acute pain of left shoulder  Invasive ductal carcinoma of breast, female, left [facility])  Aftercare following surgery for neoplasm     Problem List Patient Active Problem List   Diagnosis Date Noted  . Genetic testing 03/04/2020  . Tobacco abuse 02/26/2020  . Family history of breast cancer   . Family history of rectal cancer   . Malignant neoplasm of lower-outer quadrant of left breast of female, estrogen receptor positive ([place]) 02/13/2020    Claris Pong 04/22/2020, 1:55 PM  [place], [place], [address] Phone: [phone]   Fax:  [phone]  Name: WILLER OSORNO MRN: [id number] Date of Birth: Apr 09, 1970  Cheral Almas, PT 04/22/20 1:56 PM

## 2020-04-27 ENCOUNTER — Encounter: Payer: Self-pay | Admitting: Oncology

## 2020-04-27 ENCOUNTER — Ambulatory Visit: Payer: No Typology Code available for payment source

## 2020-04-27 ENCOUNTER — Other Ambulatory Visit: Payer: Self-pay

## 2020-04-27 DIAGNOSIS — M25512 Pain in left shoulder: Secondary | ICD-10-CM

## 2020-04-27 DIAGNOSIS — C50912 Malignant neoplasm of unspecified site of left female breast: Secondary | ICD-10-CM

## 2020-04-27 DIAGNOSIS — Z483 Aftercare following surgery for neoplasm: Secondary | ICD-10-CM

## 2020-04-27 DIAGNOSIS — R293 Abnormal posture: Secondary | ICD-10-CM | POA: Diagnosis not present

## 2020-04-27 NOTE — Therapy (Signed)
[place], [place], [address] Phone: [phone]   Fax:  [phone]  Physical Therapy Treatment  Patient Details  Name: Susan Daniel MRN: [id number] Date of Birth: 24-Nov-1970 Referring Provider (PT): Dr. Jana Hakim   Encounter Date: 04/27/2020   PT End of Session - 04/27/20 1546    Visit Number 4    Number of Visits 12    Date for PT Re-Evaluation 05/18/20    PT Start Time 2426    PT Stop Time 1556    PT Time Calculation (min) 41 min    Activity Tolerance Patient tolerated treatment well    Behavior During Therapy [facility] for tasks assessed/performed           Past Medical History:  Diagnosis Date  . Allergy   . Arthritis   . Breast cancer ([place]) 02/10/2020   Left  . Complication of anesthesia    constipation  . Family history of breast cancer   . Family history of rectal cancer     Past Surgical History:  Procedure Laterality Date  . BREAST BIOPSY Left 02/10/2020   LEFT  . BREAST LUMPECTOMY WITH RADIOACTIVE SEED AND SENTINEL LYMPH NODE BIOPSY Left 03/11/2020   Procedure: LEFT BREAST LUMPECTOMY WITH RADIOACTIVE SEED AND LEFT AXILLARY SENTINEL LYMPH NODE BIOPSY WITH BLUE DYE INJECTION;  Surgeon: Donnie Mesa, MD;  Location: [place];  Service: General;  Laterality: Left;  . DILATION AND CURETTAGE OF UTERUS    . RE-EXCISION OF BREAST LUMPECTOMY Left 03/23/2020   Procedure: RE-EXCISION OF INFERIOR MARGIN;  Surgeon: Donnie Mesa, MD;  Location: [place];  Service: General;  Laterality: Left;  . WISDOM TOOTH EXTRACTION      There were no vitals filed for this visit.   Subjective Assessment - 04/27/20 1514    Subjective Pt 13 min late then went to bathroom.  I do think I had some swelling in the arm over the weekend. I have an appt to be measured for bra next week.    Pertinent History Pt is s/p left Lumpectomy with SLNB on 03/11/2020 for Invasive Ductal Carcinoma.  She had to  have a re-excision performed on 03/23/2020.  ON 03/23/2020 she had a seroma drained that had occured after the first surgery.  She will have to have radiation at some point.                 LYMPHEDEMA/ONCOLOGY QUESTIONNAIRE - 04/27/20 0001      Left Upper Extremity Lymphedema   10 cm Proximal to Olecranon Process 27.1 cm    Olecranon Process 24.6 cm    10 cm Proximal to Ulnar Styloid Process 20 cm    Just Proximal to Ulnar Styloid Process 16.3 cm    Across Hand at [employer] 19.2 cm    At [place] of 2nd Digit 6.3 cm                      [facility] Adult PT Treatment/Exercise - 04/27/20 0001      Shoulder Exercises: Supine   Other Supine Exercises AROM shoulder flex, scaption, horizontal abd x 5      Manual Therapy   Manual therapy comments no fibrosis noted at incisions today and no visible breast swelling    Edema Management pt given TG soft to wear on left arm to decrease soreness from cording    Myofascial Release to the left axilla, forearm and arm  Passive ROM to the left shoulder into flexion, abduction, horizontal abduction,                       PT Long Term Goals - 04/06/20 1735      PT LONG TERM GOAL #1   Title Pt will be independent and compliant with HEP for shoulder ROM/strength    Time 4    Period Weeks    Status New    Target Date 05/04/20      PT LONG TERM GOAL #2   Title Pt will have left shoulder AROM WNL for improved reaching    Time 6    Period Weeks    Status New    Target Date 05/18/20      PT LONG TERM GOAL #3   Title Pt will have decreased pain by atleast 50%    Time 6    Period Weeks    Status New    Target Date 05/18/20      PT LONG TERM GOAL #4   Title Pt will be independent with self MLD prn    Time 5    Period Weeks    Status New    Target Date 05/11/20      PT LONG TERM GOAL #5   Title Quick dash will be no greater than 15% to demonstrate improved function    Baseline 41%    Time 6    Period Weeks     Status New    Target Date 05/18/20                 Plan - 04/27/20 1547    Clinical Impression Statement [facility] this [facility].  She will get measured for compression bra next week. She was given small TG soft to try on her arm to decrease soreness from cording. There was no real measurable difference in arm circumference today. Pt continues with multiple cords in left UE and had significant difficulty straightening her elbow initially but improved with repetition.  No significant breast swelling or fibrosis noted today and incision sites were soft    Stability/Clinical Decision Making Stable/Uncomplicated    Rehab Potential Excellent    PT Frequency 2x / week    PT Duration 6 weeks    PT Treatment/Interventions ADLs/Self Care Home Management;Therapeutic exercise;Manual techniques;Patient/family education;Manual lymph drainage;Passive range of motion;Vasopneumatic Device;Taping    PT Next Visit Plan MFR to left axillary  and upper arm/forearm cording, PROM left shoulder, STM to scar areas and pectoralis muscles etc, chip pack prn.  progress to strength.  see how TG soft did    PT Home Exercise Plan 4 post op exercises, ABC class, Access Code: [email or web address],    Recommended Other Services gave script for sleeve today and has appt for bra next week    Consulted and Agree with Plan of Care Patient           Patient will benefit from skilled therapeutic intervention in order to improve the following deficits and impairments:  Decreased activity tolerance,Decreased knowledge of precautions,Decreased range of motion,Decreased scar mobility,Increased edema,Increased fascial restricitons,Postural dysfunction,Impaired UE functional use,Pain  Visit Diagnosis: Abnormal posture  Acute pain of left shoulder  Aftercare following surgery for neoplasm  Invasive ductal carcinoma of breast, female, left [facility])     Problem List Patient Active  Problem List   Diagnosis Date Noted  . Genetic testing 03/04/2020  .  Tobacco abuse 02/26/2020  . Family history of breast cancer   . Family history of rectal cancer   . Malignant neoplasm of lower-outer quadrant of left breast of female, estrogen receptor positive ([place]) 02/13/2020    Claris Pong 04/27/2020, 5:12 PM  [place], [place], [address] Phone: [phone]   Fax:  [phone]  Name: Susan Daniel MRN: [id number] Date of Birth: May 23, 1970 Cheral Almas, PT 04/27/20 5:14 PM

## 2020-04-28 ENCOUNTER — Ambulatory Visit
Admission: RE | Admit: 2020-04-28 | Discharge: 2020-04-28 | Disposition: A | Discharge status: Home / Self Care | Payer: No Typology Code available for payment source | Source: Ambulatory Visit | Attending: Radiation Oncology | Admitting: Radiation Oncology

## 2020-04-28 ENCOUNTER — Other Ambulatory Visit: Payer: Self-pay

## 2020-04-28 ENCOUNTER — Encounter: Payer: Self-pay | Admitting: Radiation Oncology

## 2020-04-28 VITALS — BP 121/70 | HR 82 | Temp 97.6°F | Resp 16 | Ht 65.5 in | Wt 146.4 lb

## 2020-04-28 DIAGNOSIS — Z170 Estrogen receptor positive status [ER+]: Secondary | ICD-10-CM

## 2020-04-28 DIAGNOSIS — C50512 Malignant neoplasm of lower-outer quadrant of left female breast: Secondary | ICD-10-CM

## 2020-04-28 NOTE — Progress Notes (Signed)
Patient here today as a new follow-up appointment with Shona Meas PA. Patient states that she has a Mirena IUD implant and has been sexually active within the past 2 months. Advised patient that she might be required to do a urine pregnancy test for simulation. Patient verbalized understanding of information given.

## 2020-04-28 NOTE — Progress Notes (Signed)
Radiation Oncology         [phone] ________________________________  Name: Susan Daniel        MRN: [id number]  Date of Service: 04/28/2020 DOB: 25-Apr-1970  TM:[id number], Susan Patrick, MD  Magrinat, Virgie Dad, MD     REFERRING PHYSICIAN: Magrinat, Virgie Dad, MD   DIAGNOSIS: The encounter diagnosis was Malignant neoplasm of lower-outer quadrant of left breast of female, estrogen receptor positive ([place]).   HISTORY OF PRESENT ILLNESS: Susan Daniel is a 50 y.o. female with a diagnosis of left breast cancer. The patient was noted to have a history of thickening of the skin of the left breast in 2019 and further work up revealed a fibroadenomatous change at 5:30 measuring about 11 mm. She was to have routine screening mammogram in one year. She had repeat imaging of the breast at the breast center on 02/09/20 and this showed persistent spiculated mass in the left lower breast. An ultrasound measured an 8 mm lesion in the left breast at 6:00, and in the 5:30 position also measuring 1.4 cm. Her axilla was negative for adenopathy. She underwent a biopsy of the left breast at the 6:00 position which revealed an invasive mammary carcinoma with lobular features and also described ductal phenotype with calcifications. Her cancer was grade 2 and was ER/PR positive, HER2 negative with a Ki 67 of 10%. She's seen today to discuss treatment options of her cancer. She underwent lumpectomy of the left breast with sentinel lymph node biopsy on 03/11/2020 which showed an 1.4 cm invasive ductal carcinoma that was grade 2 also broadly involving the inferior margin and focally 1 mm from the lateral margin.  3 sampled lymph nodes were negative for metastatic disease.  Oncotype score was ordered and the score was 27 no systemic chemotherapy is planned.  She did undergo reexcision of her margins on 03/23/2020 which revealed no residual carcinoma and she is seen today to discuss moving forward with adjuvant  radiotherapy.    PREVIOUS RADIATION THERAPY: No   PAST MEDICAL HISTORY:  Past Medical History:  Diagnosis Date  . Allergy   . Arthritis   . Breast cancer ([place]) 02/10/2020   Left  . Complication of anesthesia    constipation  . Family history of breast cancer   . Family history of rectal cancer        PAST SURGICAL HISTORY: Past Surgical History:  Procedure Laterality Date  . BREAST BIOPSY Left 02/10/2020   LEFT  . BREAST LUMPECTOMY WITH RADIOACTIVE SEED AND SENTINEL LYMPH NODE BIOPSY Left 03/11/2020   Procedure: LEFT BREAST LUMPECTOMY WITH RADIOACTIVE SEED AND LEFT AXILLARY SENTINEL LYMPH NODE BIOPSY WITH BLUE DYE INJECTION;  Surgeon: Donnie Mesa, MD;  Location: [facility];  Service: General;  Laterality: Left;  . DILATION AND CURETTAGE OF UTERUS    . RE-EXCISION OF BREAST LUMPECTOMY Left 03/23/2020   Procedure: RE-EXCISION OF INFERIOR MARGIN;  Surgeon: Donnie Mesa, MD;  Location: [facility];  Service: General;  Laterality: Left;  . WISDOM TOOTH EXTRACTION       FAMILY HISTORY:  Family History  Problem Relation Age of Onset  . Heart disease Paternal Grandfather   . Aneurysm Paternal Grandfather   . Diabetes Maternal Grandmother   . Heart disease Maternal Grandmother   . Breast cancer Mother 34       second diagnosis at age 25  . Kidney disease Maternal Grandfather   . Irritable bowel syndrome Other  Cousins  . Kidney disease Maternal Uncle   . Breast cancer Cousin        dx late 30s/40s; Maternal 1st cousin, daughter of pt's mother's brother  . Rectal cancer Maternal Aunt        dx late 30s/40s     SOCIAL HISTORY:  reports that she has been smoking cigarettes. She has been smoking about 0.50 packs per day. She has never used smokeless tobacco. She reports current alcohol use. She reports that she does not use drugs. 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 She lives in [place], and has a 79 year  old daughter she's worried about telling her diagnosis to.   ALLERGIES: Codeine, Hydrocodone, and Sulfa antibiotics   MEDICATIONS:  Current Outpatient Medications  Medication Sig Dispense Refill  . ibuprofen (ADVIL,MOTRIN) 200 MG tablet Take 400 mg by mouth every 6 (six) hours as needed for moderate pain.    Marland Kitchen levonorgestrel (MIRENA) 20 MCG/24HR IUD 1 each by Intrauterine route once.    . Multiple Vitamin (MULTIVITAMIN WITH MINERALS) TABS tablet Take 1 tablet by mouth 3 (three) times a week.    . traMADol (ULTRAM) 50 MG tablet Take 1 tablet (50 mg total) by mouth every 6 (six) hours as needed for moderate pain or severe pain. (Patient not taking: Reported on 04/28/2020) 12 tablet 0   No current facility-administered medications for this encounter.     REVIEW OF SYSTEMS: On review of systems, the patient reports that she is doing well overall. She reports that she is doing well overall.  She is having some palpable suture material from her left breast incision site.  No erythema or pain is present.  No other complaints are verbalized.    PHYSICAL EXAM:  Wt Readings from Last 3 Encounters:  04/28/20 146 lb 6.4 oz (66.4 kg)  03/23/20 145 lb 4.5 oz (65.9 kg)  03/11/20 147 lb 7.8 oz (66.9 kg)   Temp Readings from Last 3 Encounters:  04/28/20 97.6 F (36.4 C)  03/23/20 97.7 F (36.5 C)  03/11/20 97.9 F (36.6 C)   BP Readings from Last 3 Encounters:  04/28/20 121/70  03/23/20 119/87  03/11/20 (!) 116/57   Pulse Readings from Last 3 Encounters:  04/28/20 82  03/23/20 60  03/11/20 70    In general this is a well appearing caucasian female in no acute distress. She's alert and oriented x4 and appropriate throughout the examination. Cardiopulmonary assessment is negative for acute distress and she exhibits normal effort.  The left breast incision site is intact, there is about a millimeter of Vicryl suture noted from the lateral aspect of the incision site without erythema  cellulitic streaking or bleeding.  There is cording of the left axilla.  ECOG = 0  0 - Asymptomatic (Fully active, able to carry on all predisease activities without restriction)  1 - Symptomatic but completely ambulatory (Restricted in physically strenuous activity but ambulatory and able to carry out work of a light or sedentary nature. For example, light housework, office work)  2 - Symptomatic, <50% in bed during the day (Ambulatory and capable of all self care but unable to carry out any work activities. Up and about more than 50% of waking hours)  3 - Symptomatic, >50% in bed, but not bedbound (Capable of only limited self-care, confined to bed or chair 50% or more of waking hours)  4 - Bedbound (Completely disabled. Cannot carry on any self-care. Totally  confined to bed or chair)  5 - Death   Eustace Pen MM, Creech RH, Tormey DC, et al. [phone]). "Toxicity and response criteria of the [facility]". [place] Oncol. 5 (6): 649-55    LABORATORY DATA:  Lab Results  Component Value Date   WBC 6.8 02/26/2020   HGB 14.0 02/26/2020   HCT 42.6 02/26/2020   MCV 94.7 02/26/2020   PLT 254 02/26/2020   Lab Results  Component Value Date   NA 141 02/26/2020   K 4.3 02/26/2020   CL 108 02/26/2020   CO2 26 02/26/2020   Lab Results  Component Value Date   ALT 15 02/26/2020   AST 14 (L) 02/26/2020   ALKPHOS 66 02/26/2020   BILITOT 0.2 (L) 02/26/2020      RADIOGRAPHY: No results found.     IMPRESSION/PLAN: 1. 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 Dr. Lisbeth Renshaw and I have discussed her case, and he does recommend proceeding with adjuvant radiotherapy.  We discussed the rationale would be to use external radiotherapy to the breast  to reduce risks of local recurrence followed by antiestrogen therapy. We discussed the risks, benefits, short, and long term effects of radiotherapy, as well as the  curative intent, and the patient is interested in proceeding. Dr. Lisbeth Renshaw has recommended 4 weeks of radiotherapy to the left breast with deep inspiration breath hold technique.  Written consent is obtained and placed in the chart, a copy was provided to the patient.  She is planning to go out of town next week and will come back for simulation on 05/12/2020 and subsequently begin treatment the week of 05/17/2020. 2. Contraceptive counseling.  The patient reports that she cannot recall when she had a cycle last prior to the placement of her IUD.  She has not formally gone through menopause as far as she knows, and she does have an IUD in place.  She is aware that while this is a very good modality of contraception, it would be recommended that we have a urine pregnancy test prior to proceeding.  She is comfortable with doing this at home and reporting any positive results.  She will do this the day of or the evening prior to her simulation.  She is also aware that best practices would also include a second modality of contraception such as condoms.  She is encouraged to call if she has any questions or concerns regarding this.  In a visit lasting 45 minutes, greater than 50% of the time was spent face to face reviewing her case, as well as in preparation of, discussing, and coordinating the patient's care.  The above documentation reflects my direct findings during this shared patient visit. Please see the separate note by Dr. Lisbeth Renshaw on this date for the remainder of the patient's plan of care.    Carola Rhine, PAC

## 2020-04-29 ENCOUNTER — Other Ambulatory Visit: Payer: Self-pay

## 2020-04-29 ENCOUNTER — Ambulatory Visit: Payer: No Typology Code available for payment source

## 2020-04-29 DIAGNOSIS — M25512 Pain in left shoulder: Secondary | ICD-10-CM

## 2020-04-29 DIAGNOSIS — C50912 Malignant neoplasm of unspecified site of left female breast: Secondary | ICD-10-CM

## 2020-04-29 DIAGNOSIS — R293 Abnormal posture: Secondary | ICD-10-CM

## 2020-04-29 DIAGNOSIS — Z483 Aftercare following surgery for neoplasm: Secondary | ICD-10-CM

## 2020-04-29 NOTE — Therapy (Signed)
[place], [place], [address] Phone: [phone]   Fax:  [phone]  Physical Therapy Treatment  Patient Details  Name: Susan Daniel MRN: [id number] Date of Birth: [birth year] Referring Provider (PT): Dr. Jana Hakim   Encounter Date: 04/29/2020   PT End of Session - 04/29/20 1543    Visit Number 5    Number of Visits 12    Date for PT Re-Evaluation 05/18/20    PT Start Time 2355    PT Stop Time 1555    PT Time Calculation (min) 49 min    Activity Tolerance Patient tolerated treatment well    Behavior During Therapy [facility] for tasks assessed/performed           Past Medical History:  Diagnosis Date  . Allergy   . Arthritis   . Breast cancer ([place]) 02/10/2020   Left  . Complication of anesthesia    constipation  . Family history of breast cancer   . Family history of rectal cancer     Past Surgical History:  Procedure Laterality Date  . BREAST BIOPSY Left 02/10/2020   LEFT  . BREAST LUMPECTOMY WITH RADIOACTIVE SEED AND SENTINEL LYMPH NODE BIOPSY Left 03/11/2020   Procedure: LEFT BREAST LUMPECTOMY WITH RADIOACTIVE SEED AND LEFT AXILLARY SENTINEL LYMPH NODE BIOPSY WITH BLUE DYE INJECTION;  Surgeon: Donnie Mesa, MD;  Location: [facility];  Service: General;  Laterality: Left;  . DILATION AND CURETTAGE OF UTERUS    . RE-EXCISION OF BREAST LUMPECTOMY Left 03/23/2020   Procedure: RE-EXCISION OF INFERIOR MARGIN;  Surgeon: Donnie Mesa, MD;  Location: [facility];  Service: General;  Laterality: Left;  . WISDOM TOOTH EXTRACTION      There were no vitals filed for this visit.   Subjective Assessment - 04/29/20 1506    Subjective I got my compression sleeve and gauntlet and I ordered another one that is not in yet.  I will have to file my insurance for it since they are out of network.I have an appt to be measured for bra next week. I didn't wear the TG soft  very long.  My arm is better  today than Tuesday and only hurts with movement..  I have radiation simulation on the 26th or 27th of April.    Pertinent History Pt is s/p left Lumpectomy with SLNB on 03/11/2020 for Invasive Ductal Carcinoma.  She had to have a re-excision performed on 03/23/2020.  ON 03/23/2020 she had a seroma drained that had occured after the first surgery.  She will have to have radiation at some point.    Patient Stated Goals Try to decrease swelling, get full mobility in arm.    Currently in Pain? Yes    Pain Score 4     Pain Location Arm    Pain Descriptors / Indicators Tightness    Pain Type Surgical pain    Pain Onset 1 to 4 weeks ago    Pain Frequency Constant                             [facility] Adult PT Treatment/Exercise - 04/29/20 0001      Shoulder Exercises: Supine   Other Supine Exercises AROM shoulder flex, scaption, horizontal abd x 5      Manual Therapy   Edema Management pt didn't care for TG soft but will start wearing her compression sleeve for several hours to get used to  it    Myofascial Release to the left axilla, forearm and arm    Passive ROM to the left shoulder into flexion, abduction, horizontal abduction,                       PT Long Term Goals - 04/06/20 1735      PT LONG TERM GOAL #1   Title Pt will be independent and compliant with HEP for shoulder ROM/strength    Time 4    Period Weeks    Status New    Target Date 05/04/20      PT LONG TERM GOAL #2   Title Pt will have left shoulder AROM WNL for improved reaching    Time 6    Period Weeks    Status New    Target Date 05/18/20      PT LONG TERM GOAL #3   Title Pt will have decreased pain by atleast 50%    Time 6    Period Weeks    Status New    Target Date 05/18/20      PT LONG TERM GOAL #4   Title Pt will be independent with self MLD prn    Time 5    Period Weeks    Status New    Target Date 05/11/20      PT LONG TERM GOAL #5   Title Quick dash will be no greater  than 15% to demonstrate improved function    Baseline 41%    Time 6    Period Weeks    Status New    Target Date 05/18/20                 Plan - 04/29/20 1544    Clinical Impression Statement Pt tried on her compression sleeve and gauntlet and both appear to fit well.  We discussed donning and doffing and showed her how to use rubber gloves.  Release techniques were done to left axilla, arm and forearm areas of cording, PROM was performed to left UE with good improvements noted. She will be fit for bras next week. We discussed how her husband might be able to help with the cording.    Stability/Clinical Decision Making Stable/Uncomplicated    Rehab Potential Excellent    PT Frequency 2x / week    PT Duration 6 weeks    PT Treatment/Interventions ADLs/Self Care Home Management;Therapeutic exercise;Manual techniques;Patient/family education;Manual lymph drainage;Passive range of motion;Vasopneumatic Device;Taping    PT Next Visit Plan MFR to left axillary  and upper arm/forearm cording, PROM left shoulder, STM to scar areas and pectoralis muscles etc, chip pack prn.  progress to strength.  see how compression sleeve did    PT Home Exercise Plan 4 post op exercises, ABC class, Access Code: [email or web address],    Recommended Other Services got sleeve today, bra next week appt    Consulted and Agree with Plan of Care Patient           Patient will benefit from skilled therapeutic intervention in order to improve the following deficits and impairments:  Decreased activity tolerance,Decreased knowledge of precautions,Decreased range of motion,Decreased scar mobility,Increased edema,Increased fascial restricitons,Postural dysfunction,Impaired UE functional use,Pain  Visit Diagnosis: Abnormal posture  Acute pain of left shoulder  Aftercare following surgery for neoplasm  Invasive ductal carcinoma of breast, female, left [facility])     Problem List Patient Active Problem List   Diagnosis Date  Noted  . Genetic testing 03/04/2020  .  Tobacco abuse 02/26/2020  . Family history of breast cancer   . Family history of rectal cancer   . Malignant neoplasm of lower-outer quadrant of left breast of female, estrogen receptor positive ([place]) 02/13/2020    Claris Pong 04/29/2020, 5:26 PM  [place], [place], [address] Phone: [phone]   Fax:  [phone]  Name: Susan Daniel MRN: [id number] Date of Birth: Jul 30, 1970 Cheral Almas, PT 04/29/20 5:27 PM

## 2020-05-03 ENCOUNTER — Encounter: Payer: Self-pay | Admitting: *Deleted

## 2020-05-11 ENCOUNTER — Other Ambulatory Visit: Payer: Self-pay

## 2020-05-11 ENCOUNTER — Ambulatory Visit: Payer: No Typology Code available for payment source

## 2020-05-11 DIAGNOSIS — M25512 Pain in left shoulder: Secondary | ICD-10-CM

## 2020-05-11 DIAGNOSIS — Z483 Aftercare following surgery for neoplasm: Secondary | ICD-10-CM

## 2020-05-11 DIAGNOSIS — C50912 Malignant neoplasm of unspecified site of left female breast: Secondary | ICD-10-CM

## 2020-05-11 DIAGNOSIS — R293 Abnormal posture: Secondary | ICD-10-CM | POA: Diagnosis not present

## 2020-05-11 NOTE — Patient Instructions (Signed)

## 2020-05-11 NOTE — Therapy (Signed)
[place], [place], [address] Phone: [phone]   Fax:  [phone]  Physical Therapy Treatment  Patient Details  Name: Susan Daniel MRN: [id number] Date of Birth: [birth year] Referring Provider (PT): Dr. Jana Hakim   Encounter Date: 05/11/2020   PT End of Session - 05/11/20 1623    Visit Number 6    Number of Visits 12    Date for PT Re-Evaluation 05/18/20    PT Start Time 1509    PT Stop Time 1610    PT Time Calculation (min) 61 min    Activity Tolerance Patient tolerated treatment well    Behavior During Therapy [facility] for tasks assessed/performed           Past Medical History:  Diagnosis Date  . Allergy   . Arthritis   . Breast cancer ([place]) 02/10/2020   Left  . Complication of anesthesia    constipation  . Family history of breast cancer   . Family history of rectal cancer     Past Surgical History:  Procedure Laterality Date  . BREAST BIOPSY Left 02/10/2020   LEFT  . BREAST LUMPECTOMY WITH RADIOACTIVE SEED AND SENTINEL LYMPH NODE BIOPSY Left 03/11/2020   Procedure: LEFT BREAST LUMPECTOMY WITH RADIOACTIVE SEED AND LEFT AXILLARY SENTINEL LYMPH NODE BIOPSY WITH BLUE DYE INJECTION;  Surgeon: Donnie Mesa, MD;  Location: [facility];  Service: General;  Laterality: Left;  . DILATION AND CURETTAGE OF UTERUS    . RE-EXCISION OF BREAST LUMPECTOMY Left 03/23/2020   Procedure: RE-EXCISION OF INFERIOR MARGIN;  Surgeon: Donnie Mesa, MD;  Location: [facility];  Service: General;  Laterality: Left;  . WISDOM TOOTH EXTRACTION      There were no vitals filed for this visit.   Subjective Assessment - 05/11/20 1511    Subjective I have radiation simulation tomorrow at 3:00.  I have worn sleeve an hour or two but its not really comfortable and I do feel like.  The cords are ever present.  I even got on line and watched someone talk about cording. no present pain    Pertinent History Pt is  s/p left Lumpectomy with SLNB on 03/11/2020 for Invasive Ductal Carcinoma.  She had to have a re-excision performed on 03/23/2020.  ON 03/23/2020 she had a seroma drained that had occured after the first surgery.  She will have to have radiation at some point.    Patient Stated Goals Try to decrease swelling, get full mobility in arm.    Currently in Pain? No/denies    Pain Score 0-No pain                             [facility] Adult PT Treatment/Exercise - 05/11/20 0001      Shoulder Exercises: Supine   Horizontal ABduction Strengthening;Both;10 reps    Theraband Level (Shoulder Horizontal ABduction) Level 1 (Yellow)    External Rotation Strengthening;Both;10 reps    Theraband Level (Shoulder External Rotation) Level 1 (Yellow)    Flexion Strengthening;Both;10 reps    Theraband Level (Shoulder Flexion) Level 1 (Yellow)    Diagonals Strengthening;Right;Left;10 reps    Theraband Level (Shoulder Diagonals) Level 1 (Yellow)      Manual Therapy   Edema Management pt has not worn sleeve lately but will try and start wearing it for short periods again    Soft tissue mobilization to left pectorals, and left arm medial and lateral in  supinel and serratus, lats and interscapular in in Right SL with cocobutter.   Myofascial Release to the left axilla, forearm and arm areas of cording    Passive ROM to the left shoulder into flexion, abduction, horizontal abduction, ER,                  PT Education - 05/11/20 1617    Education Details Pt was educated in supine scapular stabs with yellow band to perform x 10    Person(s) Educated Patient    Methods Explanation;Handout    Comprehension Returned demonstration               PT Long Term Goals - 04/06/20 1735      PT LONG TERM GOAL #1   Title Pt will be independent and compliant with HEP for shoulder ROM/strength    Time 4    Period Weeks    Status New    Target Date 05/04/20      PT LONG TERM GOAL #2   Title Pt  will have left shoulder AROM WNL for improved reaching    Time 6    Period Weeks    Status New    Target Date 05/18/20      PT LONG TERM GOAL #3   Title Pt will have decreased pain by atleast 50%    Time 6    Period Weeks    Status New    Target Date 05/18/20      PT LONG TERM GOAL #4   Title Pt will be independent with self MLD prn    Time 5    Period Weeks    Status New    Target Date 05/11/20      PT LONG TERM GOAL #5   Title Quick dash will be no greater than 15% to demonstrate improved function    Baseline 41%    Time 6    Period Weeks    Status New    Target Date 05/18/20                 Plan - 05/11/20 1623    Clinical Impression Statement Pt continues with multiple areas of cording from the axilla, upper arm and wlbow into the forearm.  2 pops were felt during MFR techniques to cording in forearm.  Soft tissue mobilization was performed to pectoralis, serratus, Lats and interscapular region in right SL.  PROM was performed to left shoulder with good improvement in ROM noted.  Pt did well with supine scapular stabs but required occasional VC's for proper form with Diagonals.  She improved with practice and was given illustrated handouts to perform at home.    Stability/Clinical Decision Making Stable/Uncomplicated    Rehab Potential Excellent    PT Frequency 2x / week    PT Duration 6 weeks    PT Treatment/Interventions ADLs/Self Care Home Management;Therapeutic exercise;Manual techniques;Patient/family education;Manual lymph drainage;Passive range of motion;Vasopneumatic Device;Taping    PT Next Visit Plan MFR to left axillary  and upper arm/forearm cording, PROM left shoulder, STM to scar areas and pectoralis muscles etc, chip pack prn.  progress to strength.  see how compression sleeve did    PT Home Exercise Plan 4 post op exercises, ABC class, Access Code: [email or web address], supine scapular stabs with yellow x 10    Consulted and Agree with Plan of Care Patient            Patient will benefit from skilled therapeutic intervention in order  to improve the following deficits and impairments:  Decreased activity tolerance,Decreased knowledge of precautions,Decreased range of motion,Decreased scar mobility,Increased edema,Increased fascial restricitons,Postural dysfunction,Impaired UE functional use,Pain  Visit Diagnosis: Abnormal posture  Acute pain of left shoulder  Aftercare following surgery for neoplasm  Invasive ductal carcinoma of breast, female, left [facility])     Problem List Patient Active Problem List   Diagnosis Date Noted  . Genetic testing 03/04/2020  . Tobacco abuse 02/26/2020  . Family history of breast cancer   . Family history of rectal cancer   . Malignant neoplasm of lower-outer quadrant of left breast of female, estrogen receptor positive ([place]) 02/13/2020    Claris Pong 05/11/2020, 4:41 PM  [place], [place], [address] Phone: [phone]   Fax:  [phone]  Name: Susan Daniel MRN: [id number] Date of Birth: [birth year] Cheral Almas, PT 05/11/20 4:43 PM

## 2020-05-12 ENCOUNTER — Ambulatory Visit
Admission: RE | Admit: 2020-05-12 | Discharge: 2020-05-12 | Disposition: A | Discharge status: Home / Self Care | Payer: No Typology Code available for payment source | Source: Ambulatory Visit | Attending: Radiation Oncology | Admitting: Radiation Oncology

## 2020-05-12 DIAGNOSIS — C50512 Malignant neoplasm of lower-outer quadrant of left female breast: Secondary | ICD-10-CM | POA: Diagnosis present

## 2020-05-12 DIAGNOSIS — Z170 Estrogen receptor positive status [ER+]: Secondary | ICD-10-CM | POA: Diagnosis not present

## 2020-05-13 ENCOUNTER — Other Ambulatory Visit: Payer: Self-pay

## 2020-05-13 ENCOUNTER — Ambulatory Visit: Payer: No Typology Code available for payment source

## 2020-05-13 DIAGNOSIS — R293 Abnormal posture: Secondary | ICD-10-CM

## 2020-05-13 DIAGNOSIS — C50912 Malignant neoplasm of unspecified site of left female breast: Secondary | ICD-10-CM

## 2020-05-13 DIAGNOSIS — Z483 Aftercare following surgery for neoplasm: Secondary | ICD-10-CM

## 2020-05-13 DIAGNOSIS — M25512 Pain in left shoulder: Secondary | ICD-10-CM

## 2020-05-13 NOTE — Therapy (Signed)
[place], [place], [address] Phone: [phone]   Fax:  [phone]  Physical Therapy Treatment  Patient Details  Name: Susan Daniel MRN: [id number] Date of Birth: 09-20-1970 Referring Provider (PT): Dr. Jana Hakim   Encounter Date: 05/13/2020   PT End of Session - 05/13/20 1731    Visit Number 7    Number of Visits 12    Date for PT Re-Evaluation 05/18/20    PT Start Time 1509    PT Stop Time 1559    PT Time Calculation (min) 50 min    Activity Tolerance Patient tolerated treatment well    Behavior During Therapy [facility] for tasks assessed/performed           Past Medical History:  Diagnosis Date  . Allergy   . Arthritis   . Breast cancer ([place]) 02/10/2020   Left  . Complication of anesthesia    constipation  . Family history of breast cancer   . Family history of rectal cancer     Past Surgical History:  Procedure Laterality Date  . BREAST BIOPSY Left 02/10/2020   LEFT  . BREAST LUMPECTOMY WITH RADIOACTIVE SEED AND SENTINEL LYMPH NODE BIOPSY Left 03/11/2020   Procedure: LEFT BREAST LUMPECTOMY WITH RADIOACTIVE SEED AND LEFT AXILLARY SENTINEL LYMPH NODE BIOPSY WITH BLUE DYE INJECTION;  Surgeon: Donnie Mesa, MD;  Location: [place];  Service: General;  Laterality: Left;  . DILATION AND CURETTAGE OF UTERUS    . RE-EXCISION OF BREAST LUMPECTOMY Left 03/23/2020   Procedure: RE-EXCISION OF INFERIOR MARGIN;  Surgeon: Donnie Mesa, MD;  Location: [place];  Service: General;  Laterality: Left;  . WISDOM TOOTH EXTRACTION      There were no vitals filed for this visit.   Subjective Assessment - 05/13/20 1509    Subjective I had the radiation simulation yesterday and it went OK. My hand went to sleep in that position but then it was fine.Start radiation next Tuesday and will do 4 weeks.  Did fine after last visit and did my exercises today.    Pertinent History Pt is s/p left  Lumpectomy with SLNB on 03/11/2020 for Invasive Ductal Carcinoma.  She had to have a re-excision performed on 03/23/2020.  ON 03/23/2020 she had a seroma drained that had occured after the first surgery.  She will have to have radiation at some point.    Patient Stated Goals Try to decrease swelling, get full mobility in arm.              [facility] PT Assessment - 05/13/20 0001      AROM   Left Shoulder Extension 63 Degrees    Left Shoulder Flexion 164 Degrees    Left Shoulder ABduction 180 Degrees                         [facility] Adult PT Treatment/Exercise - 05/13/20 0001      Shoulder Exercises: Supine   Horizontal ABduction Strengthening;Both;5 reps    Theraband Level (Shoulder Horizontal ABduction) Level 1 (Yellow)    External Rotation Strengthening;Both;5 reps    Theraband Level (Shoulder External Rotation) Level 1 (Yellow)    Flexion Strengthening;Both;5 reps    Theraband Level (Shoulder Flexion) Level 1 (Yellow)    Diagonals Strengthening;Right;Left;10 reps    Theraband Level (Shoulder Diagonals) Level 1 (Yellow)      Manual Therapy   Edema Management pt has not worn sleeve lately but will  try and start wearing it for short periods again    Soft tissue mobilization left pectorals, left medial and lateral arm after manual work with cocobutter    Myofascial Release to the left axilla, forearm and arm areas of cording    Passive ROM to the left shoulder into flexion, abduction, horizontal abduction ER,                       PT Long Term Goals - 05/13/20 1735      PT LONG TERM GOAL #1   Title Pt will be independent and compliant with HEP for shoulder ROM/strength    Time 4    Period Weeks    Status Achieved      PT LONG TERM GOAL #2   Title Pt will have left shoulder AROM WNL for improved reaching    Time 6    Period Weeks    Status Achieved      PT LONG TERM GOAL #3   Title Pt will have decreased pain by atleast 50%    Time 6    Period Weeks     Status On-going      PT LONG TERM GOAL #4   Title Pt will be independent with self MLD prn    Time 5    Period Weeks    Status New      PT LONG TERM GOAL #5   Title Quick dash will be no greater than 15% to demonstrate improved function    Baseline 41%    Time 6    Period Weeks    Status New                 Plan - 05/13/20 1732    Clinical Impression Statement Pt continues with cording in the left UE and multiple "pops" were felt today with release techniques. Pt had excellent improvement in shoulder AROM with ROM now WNL, although areas of cording are still visible during AROM.  She used good form with theraband exs with a few verbal cues required for diagonals.    Stability/Clinical Decision Making Stable/Uncomplicated    Rehab Potential Excellent    PT Frequency 2x / week    PT Duration 6 weeks    PT Treatment/Interventions ADLs/Self Care Home Management;Therapeutic exercise;Manual techniques;Patient/family education;Manual lymph drainage;Passive range of motion;Vasopneumatic Device;Taping    PT Next Visit Plan Recert,MFR to left axillary  and upper arm/forearm cording, PROM left shoulder, STM to scar areas and pectoralis muscles etc, chip pack prn.  progress to strength.  see how compression sleeve did, receive bra yet?    PT Home Exercise Plan 4 post op exercises, ABC class, Access Code: [email or web address], supine scapular stabs with yellow x 10    Recommended Other Services has not received bra yet    Consulted and Agree with Plan of Care Patient           Patient will benefit from skilled therapeutic intervention in order to improve the following deficits and impairments:  Decreased activity tolerance,Decreased knowledge of precautions,Decreased range of motion,Decreased scar mobility,Increased edema,Increased fascial restricitons,Postural dysfunction,Impaired UE functional use,Pain  Visit Diagnosis: Abnormal posture  Acute pain of left shoulder  Aftercare following  surgery for neoplasm  Invasive ductal carcinoma of breast, female, left [facility])     Problem List Patient Active Problem List   Diagnosis Date Noted  . Genetic testing 03/04/2020  . Tobacco abuse 02/26/2020  . Family history of breast cancer   .  Family history of rectal cancer   . Malignant neoplasm of lower-outer quadrant of left breast of female, estrogen receptor positive ([place]) 02/13/2020    Claris Pong 05/13/2020, 5:41 PM  [place], [place], [address] Phone: [phone]   Fax:  [phone]  Name: Susan Daniel MRN: [id number] Date of Birth: [birth year] Cheral Almas, PT 05/13/20 5:41 PM

## 2020-05-17 ENCOUNTER — Encounter: Payer: Self-pay | Admitting: *Deleted

## 2020-05-18 ENCOUNTER — Inpatient Hospital Stay: Payer: No Typology Code available for payment source | Attending: Oncology | Admitting: Oncology

## 2020-05-18 ENCOUNTER — Other Ambulatory Visit: Payer: Self-pay

## 2020-05-18 VITALS — BP 115/55 | HR 86 | Temp 98.1°F | Resp 18 | Ht 65.5 in | Wt 147.4 lb

## 2020-05-18 DIAGNOSIS — C50512 Malignant neoplasm of lower-outer quadrant of left female breast: Secondary | ICD-10-CM | POA: Insufficient documentation

## 2020-05-18 DIAGNOSIS — Z170 Estrogen receptor positive status [ER+]: Secondary | ICD-10-CM | POA: Insufficient documentation

## 2020-05-18 DIAGNOSIS — F17210 Nicotine dependence, cigarettes, uncomplicated: Secondary | ICD-10-CM | POA: Insufficient documentation

## 2020-05-18 NOTE — Progress Notes (Signed)
[place]  [phone] [phone]     ID: Susan Daniel DOB: Oct 21, 1970  MR#: [id number]  BTY#:[id number]  Patient Care Team: Wendie Agreste, MD as PCP - General (Family Medicine) Mauro Kaufmann, RN as Oncology Nurse Navigator Rockwell Germany, RN as Oncology Nurse Navigator Everlene Farrier, MD as Consulting Physician (Obstetrics and Gynecology) Donnie Mesa, MD as Consulting Physician (General Surgery) Kyung Rudd, MD as Consulting Physician (Radiation Oncology) Tomasita Beevers, Virgie Dad, MD as Consulting Physician (Oncology) Chauncey Cruel, MD OTHER MD:  CHIEF COMPLAINT: estrogen receptor positive breast cancer  CURRENT TREATMENT: Adjuvant radiation   INTERVAL HISTORY: Mariangel returns today for follow up of her estrogen receptor positive breast cancer. She was evaluated in the breast cancer clinic on 02/26/2020.  Her genetic testing results were negative.   She underwent left lumpectomy on 03/11/2020 under Dr. Georgette Dover. Pathology from the procedure ([id number]) showed: invasive ductal carcinoma, 1.4 cm, grade 2; carcinoma broadly involves inferior margin.  All three biopsied lymph nodes were negative for carcinoma (0/3).  She proceeded to re-excision of the positive margin on 03/23/2020. Pathology [phone]) showed no residual carcinoma.  Oncotype DX was obtained on the final surgical sample and the recurrence score of 20 predicts a risk of recurrence outside the breast over the next 9 years of 6%, if the patient's only systemic therapy is an antiestrogen for 5 years.  It also predicts no benefit from chemotherapy.   She was referred back to Dr. Lisbeth Renshaw on 04/28/2020. She underwent CT simulation on 05/12/2020 and is scheduled to receive radiation treatment from tomorrow, 05/19/2020, through 06/16/2020.   REVIEW OF SYSTEMS: Debi did very well with both her breast surgeries.  She had no significant bleeding or fever and very little pain.  She  did develop cording and she is now receiving physical therapy for that and improved.  A detailed review of systems today was otherwise stable.   COVID 19 VACCINATION STATUS: Never vaccinated; may have had Covid in January 2020 but not tested   HISTORY OF CURRENT ILLNESS: From the original intake note:  JANNATUL WOJDYLA had routine screening mammography on showing a possible abnormality in the left breast. She underwent left diagnostic mammography with tomography and left breast ultrasonography at [facility] on 02/09/2020 showing: breast density category C; 0.8 cm spiculated mass in left breast at 6 o'clock; 1.4 cm hypoechoic mass in left breast at 5:30, unchanged compared to prior ultrasound in 08/2017; ultrasound of left axilla is negative.  Accordingly on 02/10/2020 she proceeded to biopsy of the left breast area in question. 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 Prognostic indicators significant for: estrogen receptor, 80% positive with moderate-strong staining intensity and progesterone receptor, 100% positive with strong staining intensity. Proliferation marker Ki67 at 10%. HER2 negative by immunohistochemistry (1+).  Cancer Staging Malignant neoplasm of lower-outer quadrant of left breast of female, estrogen receptor positive ([place]) Staging form: Breast, AJCC 8th Edition - Clinical stage from 02/17/2020: Stage IA (cT1b, cN0, cM0, G2, ER+, PR+, HER2+) - Signed by Chauncey Cruel, MD on 02/26/2020 Stage prefix: Initial diagnosis Method of lymph node assessment: Clinical - Pathologic stage from 03/23/2020: Stage IA (pT1c, pN0, cM0, G1, ER+, PR+, HER2-) - Signed by Gardenia Phlegm, NP on 03/31/2020 Histologic grading system: 3 grade system   The patient's subsequent history is as detailed below.   PAST MEDICAL HISTORY: Past Medical History:  Diagnosis  Date  .  Allergy   . Arthritis   . Breast cancer ([place]) 02/10/2020   Left  . Complication of anesthesia    constipation  . Family history of breast cancer   . Family history of rectal cancer     PAST SURGICAL HISTORY: Past Surgical History:  Procedure Laterality Date  . BREAST BIOPSY Left 02/10/2020   LEFT  . BREAST LUMPECTOMY WITH RADIOACTIVE SEED AND SENTINEL LYMPH NODE BIOPSY Left 03/11/2020   Procedure: LEFT BREAST LUMPECTOMY WITH RADIOACTIVE SEED AND LEFT AXILLARY SENTINEL LYMPH NODE BIOPSY WITH BLUE DYE INJECTION;  Surgeon: Donnie Mesa, MD;  Location: [place];  Service: General;  Laterality: Left;  . DILATION AND CURETTAGE OF UTERUS    . RE-EXCISION OF BREAST LUMPECTOMY Left 03/23/2020   Procedure: RE-EXCISION OF INFERIOR MARGIN;  Surgeon: Donnie Mesa, MD;  Location: [place];  Service: General;  Laterality: Left;  . WISDOM TOOTH EXTRACTION      FAMILY HISTORY: Family History  Problem Relation Age of Onset  . Heart disease Paternal Grandfather   . Aneurysm Paternal Grandfather   . Diabetes Maternal Grandmother   . Heart disease Maternal Grandmother   . Breast cancer Mother 61       second diagnosis at age 8  . Kidney disease Maternal Grandfather   . Irritable bowel syndrome Other        Cousins  . Kidney disease Maternal Uncle   . Breast cancer Cousin        dx late 30s/40s; Maternal 1st cousin, daughter of pt's mother's brother  . Rectal cancer Maternal Aunt        dx late 30s/40s   Her father died at age 58 shortly after the patient's mother died at age 83 from from what may have been complications of an autologous transplant at [facility] for her breast cancer.. She had a history of breast cancer twice, first at age 57 then again at age 78. Katriana is an only child. For full family information, see genetic counseling note from 02/24/2020.   GYNECOLOGIC HISTORY:  No LMP recorded. (Menstrual status: IUD). Menarche: 11 or 50 years old Age at first live birth: 50  years old, after multiple miscarriages GX P 1 LMP Mirena in place HRT no  Hysterectomy? no BSO? no   SOCIAL HISTORY: (updated 02/2020)  Lameeka works in Press photographer.  Her husband [name] is an Chief Financial Officer.  Their daughter Westly Pam is 45.  The patient in addition has a blue healer and a [place] bulldog at home.  She is a Psychologist, forensic    ADVANCED DIRECTIVES: In the absence of any documents to the contrary the patient's husband is her healthcare power of attorney   HEALTH MAINTENANCE: Social History   Tobacco Use  . Smoking status: Current Every Day Smoker    Packs/day: 0.50    Types: Cigarettes  . Smokeless tobacco: Never Used  Vaping Use  . Vaping Use: Some days  Substance Use Topics  . Alcohol use: Yes    Comment: ocassionally  . Drug use: No     Colonoscopy: n/a  PAP: Up-to-date  Bone density: Never   Allergies  Allergen Reactions  . Codeine Nausea And Vomiting  . Hydrocodone Nausea And Vomiting  . Sulfa Antibiotics Hives    Current Outpatient Medications  Medication Sig Dispense Refill  . ibuprofen (ADVIL,MOTRIN) 200 MG tablet Take 400 mg by mouth every 6 (six) hours as needed for moderate pain.    Marland Kitchen levonorgestrel (MIRENA) 20 MCG/24HR  IUD 1 each by Intrauterine route once.    . Multiple Vitamin (MULTIVITAMIN WITH MINERALS) TABS tablet Take 1 tablet by mouth 3 (three) times a week.    . traMADol (ULTRAM) 50 MG tablet Take 1 tablet (50 mg total) by mouth every 6 (six) hours as needed for moderate pain or severe pain. (Patient not taking: Reported on 04/28/2020) 12 tablet 0   No current facility-administered medications for this visit.    OBJECTIVE: White woman who appears well  Vitals:   05/18/20 1552  BP: (!) 115/55  Pulse: 86  Resp: 18  Temp: 98.1 F (36.7 C)  SpO2: 99%     Body mass index is 24.16 kg/m.   Wt Readings from Last 3 Encounters:  05/18/20 147 lb 6.4 oz (66.9 kg)  04/28/20 146 lb 6.4 oz (66.4 kg)  03/23/20 145 lb 4.5 oz (65.9 kg)      ECOG FS:1 -  Symptomatic but completely ambulatory  Sclerae unicteric, EOMs intact Wearing a mask No cervical or supraclavicular adenopathy Lungs no rales or rhonchi Heart regular rate and rhythm Abd soft, nontender, positive bowel sounds MSK no focal spinal tenderness, no upper extremity lymphedema Neuro: nonfocal, well oriented, appropriate affect Breasts: The right breast is unremarkable.  The left breast is status post recent lumpectomy.  The incision is healing nicely.  The cosmetic result is excellent.  Both axillae are benign.   LAB RESULTS:  CMP     Component Value Date/Time   NA 141 02/26/2020 1547   K 4.3 02/26/2020 1547   CL 108 02/26/2020 1547   CO2 26 02/26/2020 1547   GLUCOSE 88 02/26/2020 1547   BUN 15 02/26/2020 1547   CREATININE 0.92 02/26/2020 1547   CREATININE 0.73 10/25/2012 1220   CALCIUM 9.0 02/26/2020 1547   PROT 6.4 (L) 02/26/2020 1547   ALBUMIN 3.8 02/26/2020 1547   AST 14 (L) 02/26/2020 1547   ALT 15 02/26/2020 1547   ALKPHOS 66 02/26/2020 1547   BILITOT 0.2 (L) 02/26/2020 1547   GFRNONAA >60 02/26/2020 1547   GFRAA  12/05/2007 0529    >60        The eGFR has been calculated using the MDRD equation. This calculation has not been validated in all clinical    No results found for: TOTALPROTELP, ALBUMINELP, A1GS, A2GS, BETS, BETA2SER, GAMS, MSPIKE, SPEI  Lab Results  Component Value Date   WBC 6.8 02/26/2020   NEUTROABS 4.0 02/26/2020   HGB 14.0 02/26/2020   HCT 42.6 02/26/2020   MCV 94.7 02/26/2020   PLT 254 02/26/2020    No results found for: LABCA2  No components found for: [id number]  No results for input(s): INR in the last 168 hours.  No results found for: LABCA2  No results found for: WPY099  No results found for: IPJ825  No results found for: KNL976  No results found for: CA2729  No components found for: HGQUANT  No results found for: CEA1 / No results found for: CEA1   No results found for: AFPTUMOR  No results found for:  CHROMOGRNA  No results found for: KPAFRELGTCHN, LAMBDASER, KAPLAMBRATIO (kappa/lambda light chains)  No results found for: HGBA, HGBA2QUANT, HGBFQUANT, HGBSQUAN (Hemoglobinopathy evaluation)   Lab Results  Component Value Date   LDH 120 12/05/2007    No results found for: IRON, TIBC, IRONPCTSAT (Iron and TIBC)  No results found for: FERRITIN  Urinalysis No results found for: COLORURINE, APPEARANCEUR, LABSPEC, PHURINE, GLUCOSEU, [place], BILIRUBINUR, KETONESUR, [place], [place], NITRITE, LEUKOCYTESUR  STUDIES: No results found.   ELIGIBLE FOR AVAILABLE RESEARCH PROTOCOL: No  ASSESSMENT: 50 y.o. Fernand Parkins woman status post left breast lower outer quadrant biopsy 02/10/2020 for a clinical T1b N0, stage IA invasive ductal carcinoma, grade 2, estrogen and progesterone receptor positive, HER-2 not amplified, with an MIB-1 of 10%.  (1) genetics testing 03/01/2018 to 03/10/2020 through the  [facility] panel and [facility] panel found no deleterious mutations in ATM, BRCA1, BRCA2, CDH1, CHEK2, PALB2, PTEN, and TP53;  APC, ATM, AXIN2, BARD1, BMPR1A, BRCA1, BRCA2, BRIP1, CDH1, CDK4, CDKN2A, CHEK2, DICER1, EPCAM, GREM1, HOXB13, MEN1, MLH1, MSH2, MSH3, MSH6, MUTYH, NBN, NF1, NF2, NTHL1, PALB2, PMS2, POLD1, POLE, PTEN, RAD51C, RAD51D, RECQL, RET, SDHA, SDHAF2, SDHB, SDHC, SDHD, SMAD4, SMARCA4, STK11, TP53, TSC1, TSC2, and VHL.  RNA data is routinely analyzed for use in variant interpretation for all genes.  (2) left lumpectomy 03/11/2020 showed a pT1c pN0, stage IA  invasive ductal carcinoma, grade 2, with positive margins.  (a) a total of 3 left axillary lymph nodes were removed  (b) additional surgery 03/24/2018 cleared the margins  (3) Oncotype score of 20 predicts a risk of recurrence outside the breast within the next 9 years of 6% if the patient's only systemic therapy is antiestrogens for 5 years.  He is also predicts no benefit from chemotherapy.  (4)  adjuvant radiation pending  (5) to start tamoxifen after completion of local treatment  (a) Mirena IUD in place   PLAN: I discussed the Oncotype results in detail with Judeen Hammans and she understands she has an excellent chance of not having breast cancer recur outside the breast assuming she takes an antiestrogen for 5 years.  She understands chemotherapy would not improve that risk profile.  We discussed antiestrogens in detail and given that she has a Mirena in place and her age I think tamoxifen would be the better choice for her.  She has a good understanding of the possible toxicities side effects and complications of this agent including the risk of endometrial carcinoma (which should be decreased with the Mirena in place) and the risk of blood clots (which is similar to the risk she underwent while taking antiestrogens).  We discussed radiation issues and she is eager to get started.  She is going to return to see me about a month after completing radiation.  We will start tamoxifen at that time.  She knows to call for any other issue that may develop before then.  Total encounter time 25 minutes.Sarajane Jews C. Jamee Pacholski, MD 05/18/2020 4:02 PM Medical Oncology and Hematology [facility] [place], [place] [address] Tel. [phone]    Fax. [phone]   This document serves as a record of services personally performed by Lurline Del, MD. It was created on his behalf by Wilburn Mylar, a trained medical scribe. The creation of this record is based on the scribe's personal observations and the provider's statements to them.   I, Lurline Del MD, have reviewed the above documentation for accuracy and completeness, and I agree with the above.   *Total Encounter Time as defined by the Centers for Medicare and Medicaid Services includes, in addition to the face-to-face time of a patient visit (documented in the note above) non-face-to-face time: obtaining and  reviewing outside history, ordering and reviewing medications, tests or procedures, care coordination (communications with other health care professionals or caregivers) and documentation in the medical record.

## 2020-05-19 ENCOUNTER — Ambulatory Visit: Payer: No Typology Code available for payment source | Attending: Oncology

## 2020-05-19 ENCOUNTER — Ambulatory Visit
Admission: RE | Admit: 2020-05-19 | Discharge: 2020-05-19 | Disposition: A | Discharge status: Home / Self Care | Payer: No Typology Code available for payment source | Source: Ambulatory Visit | Attending: Radiation Oncology | Admitting: Radiation Oncology

## 2020-05-19 ENCOUNTER — Other Ambulatory Visit: Payer: Self-pay

## 2020-05-19 DIAGNOSIS — C50512 Malignant neoplasm of lower-outer quadrant of left female breast: Secondary | ICD-10-CM | POA: Diagnosis not present

## 2020-05-19 DIAGNOSIS — Z483 Aftercare following surgery for neoplasm: Secondary | ICD-10-CM | POA: Diagnosis present

## 2020-05-19 DIAGNOSIS — C50912 Malignant neoplasm of unspecified site of left female breast: Secondary | ICD-10-CM | POA: Diagnosis present

## 2020-05-19 DIAGNOSIS — R293 Abnormal posture: Secondary | ICD-10-CM | POA: Diagnosis present

## 2020-05-19 DIAGNOSIS — M25512 Pain in left shoulder: Secondary | ICD-10-CM | POA: Insufficient documentation

## 2020-05-19 NOTE — Therapy (Signed)
[place], [place], [address] Phone: [phone]   Fax:  [phone]  Physical Therapy Treatment  Patient Details  Name: Susan Daniel MRN: [id number] Date of Birth: [birth year] Referring Provider (PT): Dr. Jana Hakim   Encounter Date: 05/19/2020   PT End of Session - 05/19/20 0833    Visit Number 8    Number of Visits 20    Date for PT Re-Evaluation 06/30/20    PT Start Time 0806    PT Stop Time 0856    PT Time Calculation (min) 50 min    Activity Tolerance Patient tolerated treatment well    Behavior During Therapy [facility] for tasks assessed/performed           Past Medical History:  Diagnosis Date  . Allergy   . Arthritis   . Breast cancer ([place]) 02/10/2020   Left  . Complication of anesthesia    constipation  . Family history of breast cancer   . Family history of rectal cancer     Past Surgical History:  Procedure Laterality Date  . BREAST BIOPSY Left 02/10/2020   LEFT  . BREAST LUMPECTOMY WITH RADIOACTIVE SEED AND SENTINEL LYMPH NODE BIOPSY Left 03/11/2020   Procedure: LEFT BREAST LUMPECTOMY WITH RADIOACTIVE SEED AND LEFT AXILLARY SENTINEL LYMPH NODE BIOPSY WITH BLUE DYE INJECTION;  Surgeon: Donnie Mesa, MD;  Location: [place];  Service: General;  Laterality: Left;  . DILATION AND CURETTAGE OF UTERUS    . RE-EXCISION OF BREAST LUMPECTOMY Left 03/23/2020   Procedure: RE-EXCISION OF INFERIOR MARGIN;  Surgeon: Donnie Mesa, MD;  Location: [place];  Service: General;  Laterality: Left;  . WISDOM TOOTH EXTRACTION      There were no vitals filed for this visit.   Subjective Assessment - 05/19/20 0807    Subjective I saw Dr. Jana Hakim yesterday and start radiation today at 12:15.  I wore my sleeve yesterday while I mowed the yard. Cording is still there but feeling pretty good.  Some of the things that used to bother me aren't as bad now. Overall pain is 75% better.     Pertinent History Pt is s/p left Lumpectomy with SLNB on 03/11/2020 for Invasive Ductal Carcinoma.  She had to have a re-excision performed on 03/23/2020.  ON 03/23/2020 she had a seroma drained that had occured after the first surgery.  She will have to have radiation at some point.    Patient Stated Goals Try to decrease swelling, get full mobility in arm.    Currently in Pain? No/denies    Pain Score 0-No pain                 LYMPHEDEMA/ONCOLOGY QUESTIONNAIRE - 05/19/20 0001      Right Upper Extremity Lymphedema   10 cm Proximal to Olecranon Process 27 cm    Olecranon Process 25.4 cm    10 cm Proximal to Ulnar Styloid Process 20 cm    Just Proximal to Ulnar Styloid Process 16.2 cm    At Base of 2nd Digit 6.4 cm      Left Upper Extremity Lymphedema   10 cm Proximal to Olecranon Process 27.1 cm    Olecranon Process 24.8 cm    10 cm Proximal to Ulnar Styloid Process 20.3 cm    Just Proximal to Ulnar Styloid Process 16.4 cm    At Base of 2nd Digit 6.1 cm  [facility] Adult PT Treatment/Exercise - 05/19/20 0001      Shoulder Exercises: Supine   Horizontal ABduction Strengthening;Both;10 reps    Theraband Level (Shoulder Horizontal ABduction) Level 1 (Yellow)    External Rotation Strengthening;Both;10 reps    Theraband Level (Shoulder External Rotation) Level 1 (Yellow)    Flexion Strengthening;Both;10 reps    Theraband Level (Shoulder Flexion) Level 1 (Yellow)    Diagonals Strengthening;Right;Left;10 reps    Theraband Level (Shoulder Diagonals) Level 1 (Yellow)      Shoulder Exercises: Standing   Extension Strengthening;Both;10 reps    Theraband Level (Shoulder Extension) Level 1 (Yellow)    Retraction Strengthening;Both;10 reps    Theraband Level (Shoulder Retraction) Level 1 (Yellow)      Manual Therapy   Myofascial Release to the left axilla, forearm and arm areas of cording    Passive ROM to the left shoulder into flexion, abduction,  horizontal abduction ER,                       PT Long Term Goals - 05/19/20 0809      PT LONG TERM GOAL #1   Title Pt will be independent and compliant with HEP for shoulder ROM/strength    Time 4    Period Weeks    Status Achieved      PT LONG TERM GOAL #2   Title Pt will have left shoulder AROM WNL for improved reaching    Time 6    Period Weeks    Status Achieved      PT LONG TERM GOAL #3   Title Pt will have decreased pain by atleast 50%    Baseline 75% better    Time 6    Period Weeks    Status Achieved      PT LONG TERM GOAL #4   Title Pt will be independent with self MLD prn    Time 5    Period Weeks      PT LONG TERM GOAL #5   Title Quick dash will be no greater than 15% to demonstrate improved function    Baseline 41%    Time 6    Period Weeks    Status On-going                 Plan - 05/19/20 2831    Clinical Impression Statement Pt has achieved pain goals and shoulder AROM goals and is very compliant with HEP.  She required occasional VC's this morning with her HEP. She continues to have cording although not interfering as much with her home activities.  She will start radiation today and she has been advised to continue monitoring her flexibility and swelling. there is very mild swelling noted at wrist and thumb region    Stability/Clinical Decision Making Stable/Uncomplicated    Rehab Potential Excellent    PT Frequency 2x / week    PT Duration 6 weeks    PT Treatment/Interventions ADLs/Self Care Home Management;Therapeutic exercise;Manual techniques;Patient/family education;Manual lymph drainage;Passive range of motion;Vasopneumatic Device;Taping    PT Next Visit Plan Recert,MFR to left axillary  and upper arm/forearm cording, PROM left shoulder, STM to scar areas and pectoralis muscles etc, chip pack prn.  progress to strength.  , receive bra yet? add standing TB to HEP    PT Home Exercise Plan 4 post op exercises, ABC class, Access  Code: [email or web address], supine scapular stabs with yellow x 10    Consulted and Agree with Plan of Care  Patient           Patient will benefit from skilled therapeutic intervention in order to improve the following deficits and impairments:  Decreased activity tolerance,Decreased knowledge of precautions,Decreased range of motion,Decreased scar mobility,Increased edema,Increased fascial restricitons,Postural dysfunction,Impaired UE functional use,Pain  Visit Diagnosis: Abnormal posture  Acute pain of left shoulder  Aftercare following surgery for neoplasm  Invasive ductal carcinoma of breast, female, left [facility])     Problem List Patient Active Problem List   Diagnosis Date Noted  . Genetic testing 03/04/2020  . Tobacco abuse 02/26/2020  . Family history of breast cancer   . Family history of rectal cancer   . Malignant neoplasm of lower-outer quadrant of left breast of female, estrogen receptor positive ([place]) 02/13/2020    Claris Pong 05/19/2020, 8:59 AM  [place], [place], [address] Phone: [phone]   Fax:  [phone]  Name: Susan Daniel MRN: [id number] Date of Birth: [birth year] Cheral Almas, PT 05/19/20 9:00 AM

## 2020-05-20 ENCOUNTER — Ambulatory Visit
Admission: RE | Admit: 2020-05-20 | Discharge: 2020-05-20 | Disposition: A | Discharge status: Home / Self Care | Payer: No Typology Code available for payment source | Source: Ambulatory Visit | Attending: Radiation Oncology | Admitting: Radiation Oncology

## 2020-05-20 DIAGNOSIS — C50512 Malignant neoplasm of lower-outer quadrant of left female breast: Secondary | ICD-10-CM | POA: Diagnosis not present

## 2020-05-20 NOTE — Progress Notes (Signed)
Pt here for patient teaching.  Pt given Radiation and You booklet, skin care instructions, Alra deodorant and Radiaplex gel.  Reviewed areas of pertinence such as fatigue, hair loss, skin changes, breast tenderness and breast swelling . Pt able to give teach back of to pat skin and use unscented/gentle soap,apply Radiaplex bid, avoid applying anything to skin within 4 hours of treatment, avoid wearing an under wire bra and to use an electric razor if they must shave. Pt verbalizes understanding of information given and will contact nursing with any questions or concerns.     Http://rtanswers.org/treatmentinformation/whattoexpect/index  Jadrien Narine M. Latesa Fratto RN, BSN    

## 2020-05-21 ENCOUNTER — Ambulatory Visit
Admission: RE | Admit: 2020-05-21 | Discharge: 2020-05-21 | Disposition: A | Discharge status: Home / Self Care | Payer: No Typology Code available for payment source | Source: Ambulatory Visit | Attending: Radiation Oncology | Admitting: Radiation Oncology

## 2020-05-21 ENCOUNTER — Other Ambulatory Visit: Payer: Self-pay

## 2020-05-21 DIAGNOSIS — Z170 Estrogen receptor positive status [ER+]: Secondary | ICD-10-CM

## 2020-05-21 DIAGNOSIS — C50512 Malignant neoplasm of lower-outer quadrant of left female breast: Secondary | ICD-10-CM | POA: Diagnosis not present

## 2020-05-21 MED ORDER — RADIAPLEXRX EX GEL: Freq: Once | CUTANEOUS | Status: AC

## 2020-05-21 MED ORDER — ALRA NON-METALLIC DEODORANT (RAD-ONC)
1.0000 "application " | Freq: Once | TOPICAL | Status: AC
  Administered 2020-05-21: 1 via TOPICAL

## 2020-05-24 ENCOUNTER — Telehealth: Payer: Self-pay | Admitting: Oncology

## 2020-05-24 ENCOUNTER — Ambulatory Visit
Admission: RE | Admit: 2020-05-24 | Discharge: 2020-05-24 | Disposition: A | Discharge status: Home / Self Care | Payer: No Typology Code available for payment source | Source: Ambulatory Visit | Attending: Radiation Oncology | Admitting: Radiation Oncology

## 2020-05-24 ENCOUNTER — Other Ambulatory Visit: Payer: Self-pay

## 2020-05-24 DIAGNOSIS — C50512 Malignant neoplasm of lower-outer quadrant of left female breast: Secondary | ICD-10-CM | POA: Diagnosis not present

## 2020-05-24 NOTE — Telephone Encounter (Signed)
Sch per 5/3 los, Patient aware.  

## 2020-05-25 ENCOUNTER — Ambulatory Visit
Admission: RE | Admit: 2020-05-25 | Discharge: 2020-05-25 | Disposition: A | Discharge status: Home / Self Care | Payer: No Typology Code available for payment source | Source: Ambulatory Visit | Attending: Radiation Oncology | Admitting: Radiation Oncology

## 2020-05-25 ENCOUNTER — Ambulatory Visit: Payer: No Typology Code available for payment source

## 2020-05-25 DIAGNOSIS — Z483 Aftercare following surgery for neoplasm: Secondary | ICD-10-CM

## 2020-05-25 DIAGNOSIS — C50512 Malignant neoplasm of lower-outer quadrant of left female breast: Secondary | ICD-10-CM | POA: Diagnosis not present

## 2020-05-25 DIAGNOSIS — C50912 Malignant neoplasm of unspecified site of left female breast: Secondary | ICD-10-CM

## 2020-05-25 DIAGNOSIS — R293 Abnormal posture: Secondary | ICD-10-CM

## 2020-05-25 DIAGNOSIS — M25512 Pain in left shoulder: Secondary | ICD-10-CM

## 2020-05-25 NOTE — Patient Instructions (Signed)
Access Code: [email or web address] URL: [email or web address] Date: 05/25/2020 Prepared by: Cheral Almas  Exercises Scapular Retraction with Resistance - 1 x daily - 3 x weekly - 1 sets - 15 reps Scapular Retraction with Resistance Advanced - 1 x daily - 3 x weekly - 1 sets - 15 reps Shoulder External Rotation and Scapular Retraction with Resistance - 1 x daily - 3 x weekly - 1 sets - 10 reps

## 2020-05-25 NOTE — Therapy (Signed)
[place], [place], [address] Phone: [phone]   Fax:  [phone]  Physical Therapy Treatment  Patient Details  Name: Susan Daniel MRN: [id number] Date of Birth: 08-25-1970 Referring Provider (PT): Dr. Jana Hakim   Encounter Date: 05/25/2020   PT End of Session - 05/25/20 1512    Visit Number 9    Number of Visits 20    Date for PT Re-Evaluation 06/30/20    PT Start Time 1508    PT Stop Time 1557    PT Time Calculation (min) 49 min    Activity Tolerance Patient tolerated treatment well    Behavior During Therapy [facility] for tasks assessed/performed           Past Medical History:  Diagnosis Date  . Allergy   . Arthritis   . Breast cancer ([place]) 02/10/2020   Left  . Complication of anesthesia    constipation  . Family history of breast cancer   . Family history of rectal cancer     Past Surgical History:  Procedure Laterality Date  . BREAST BIOPSY Left 02/10/2020   LEFT  . BREAST LUMPECTOMY WITH RADIOACTIVE SEED AND SENTINEL LYMPH NODE BIOPSY Left 03/11/2020   Procedure: LEFT BREAST LUMPECTOMY WITH RADIOACTIVE SEED AND LEFT AXILLARY SENTINEL LYMPH NODE BIOPSY WITH BLUE DYE INJECTION;  Surgeon: Donnie Mesa, MD;  Location: [place];  Service: General;  Laterality: Left;  . DILATION AND CURETTAGE OF UTERUS    . RE-EXCISION OF BREAST LUMPECTOMY Left 03/23/2020   Procedure: RE-EXCISION OF INFERIOR MARGIN;  Surgeon: Donnie Mesa, MD;  Location: [place];  Service: General;  Laterality: Left;  . WISDOM TOOTH EXTRACTION      There were no vitals filed for this visit.   Subjective Assessment - 05/25/20 1509    Subjective Radiation seems to be going well.  I am using the cream and don't have any itching or anything yet.  My arm is feeling alot better even though the cording is still there.  Didn't get my bra yet.    Pertinent History Pt is s/p left Lumpectomy with SLNB on  03/11/2020 for Invasive Ductal Carcinoma.  She had to have a re-excision performed on 03/23/2020.  ON 03/23/2020 she had a seroma drained that had occured after the first surgery.  She will have to have radiation at some point.    Currently in Pain? No/denies    Pain Score 0-No pain                             [facility] Adult PT Treatment/Exercise - 05/25/20 0001      Shoulder Exercises: Standing   External Rotation Strengthening;Both;10 reps    Theraband Level (Shoulder External Rotation) Level 1 (Yellow)    Extension Strengthening;Both;15 reps    Theraband Level (Shoulder Extension) Level 1 (Yellow)    Retraction Strengthening;Both;15 reps    Theraband Level (Shoulder Retraction) Level 1 (Yellow)      Shoulder Exercises: Therapy Ball   Flexion Both;10 reps      Manual Therapy   Soft tissue mobilization left pectorals, left medial and lateral arm after manual work with cocobutter    Myofascial Release to the left axilla, forearm and arm areas of cording    Passive ROM to the left shoulder into flexion, abduction, D2 flex, ER,  PT Education - 05/25/20 1701    Education Details Pt was educated in standing scapular retraction, shoulder extension and bilateral ER with yellow TB    Person(s) Educated Patient    Methods Explanation;Handout;Demonstration    Comprehension Returned demonstration               PT Long Term Goals - 05/19/20 0809      PT LONG TERM GOAL #1   Title Pt will be independent and compliant with HEP for shoulder ROM/strength    Time 4    Period Weeks    Status Achieved      PT LONG TERM GOAL #2   Title Pt will have left shoulder AROM WNL for improved reaching    Time 6    Period Weeks    Status Achieved      PT LONG TERM GOAL #3   Title Pt will have decreased pain by atleast 50%    Baseline 75% better    Time 6    Period Weeks    Status Achieved      PT LONG TERM GOAL #4   Title Pt will be independent with  self MLD prn    Time 5    Period Weeks      PT LONG TERM GOAL #5   Title Quick dash will be no greater than 15% to demonstrate improved function    Baseline 41%    Time 6    Period Weeks    Status On-going                 Plan - 05/25/20 1513    Clinical Impression Statement Pts arm is feeling much better overall despite cording still being present in axilla and forearm.  She is maintaining good shoulder ROM thus far as she goes through radiation and she has no ill skin effects from radiation at this point.  She does still have mild swelling at wrist and thumb and was advised to try wearing her sleeve a little more. She used good form with theraband exercises.    Stability/Clinical Decision Making Stable/Uncomplicated    Rehab Potential Excellent    PT Frequency 2x / week    PT Duration 6 weeks    PT Treatment/Interventions ADLs/Self Care Home Management;Therapeutic exercise;Manual techniques;Patient/family education;Manual lymph drainage;Passive range of motion;Vasopneumatic Device;Taping    PT Next Visit Plan MFR to left axillary  and upper arm/forearm cording, PROM left shoulder, STM to scar areas and pectoralis muscles etc, chip pack prn.  progress to strength.  , receive bra yet?    PT Home Exercise Plan 4 post op exercises, ABC class, Access Code: [email or web address], supine scapular stabs with yellow x 10, standing theraband exs    Recommended Other Services needs to reschedule appt for bra    Consulted and Agree with Plan of Care Patient           Patient will benefit from skilled therapeutic intervention in order to improve the following deficits and impairments:  Decreased activity tolerance,Decreased knowledge of precautions,Decreased range of motion,Decreased scar mobility,Increased edema,Increased fascial restricitons,Postural dysfunction,Impaired UE functional use,Pain  Visit Diagnosis: Abnormal posture  Acute pain of left shoulder  Aftercare following surgery for  neoplasm  Invasive ductal carcinoma of breast, female, left [facility])     Problem List Patient Active Problem List   Diagnosis Date Noted  . Genetic testing 03/04/2020  . Tobacco abuse 02/26/2020  . Family history of breast cancer   . Family history of  rectal cancer   . Malignant neoplasm of lower-outer quadrant of left breast of female, estrogen receptor positive ([place]) 02/13/2020    Claris Pong 05/25/2020, 5:09 PM  [place], [place], [address] Phone: [phone]   Fax:  [phone]  Name: ISABELLAH SOBOCINSKI MRN: [id number] Date of Birth: 28-Aug-1970 Cheral Almas, PT 05/25/20 5:10 PM

## 2020-05-26 ENCOUNTER — Other Ambulatory Visit: Payer: Self-pay

## 2020-05-26 ENCOUNTER — Ambulatory Visit
Admission: RE | Admit: 2020-05-26 | Discharge: 2020-05-26 | Disposition: A | Discharge status: Home / Self Care | Payer: No Typology Code available for payment source | Source: Ambulatory Visit | Attending: Radiation Oncology | Admitting: Radiation Oncology

## 2020-05-26 DIAGNOSIS — C50512 Malignant neoplasm of lower-outer quadrant of left female breast: Secondary | ICD-10-CM | POA: Diagnosis not present

## 2020-05-27 ENCOUNTER — Ambulatory Visit: Payer: No Typology Code available for payment source

## 2020-05-27 ENCOUNTER — Ambulatory Visit
Admission: RE | Admit: 2020-05-27 | Discharge: 2020-05-27 | Disposition: A | Discharge status: Home / Self Care | Payer: No Typology Code available for payment source | Source: Ambulatory Visit | Attending: Radiation Oncology | Admitting: Radiation Oncology

## 2020-05-27 DIAGNOSIS — C50512 Malignant neoplasm of lower-outer quadrant of left female breast: Secondary | ICD-10-CM | POA: Diagnosis not present

## 2020-05-28 ENCOUNTER — Other Ambulatory Visit: Payer: Self-pay

## 2020-05-28 ENCOUNTER — Ambulatory Visit
Admission: RE | Admit: 2020-05-28 | Discharge: 2020-05-28 | Disposition: A | Discharge status: Home / Self Care | Payer: No Typology Code available for payment source | Source: Ambulatory Visit | Attending: Radiation Oncology | Admitting: Radiation Oncology

## 2020-05-28 DIAGNOSIS — C50512 Malignant neoplasm of lower-outer quadrant of left female breast: Secondary | ICD-10-CM | POA: Diagnosis not present

## 2020-05-31 ENCOUNTER — Ambulatory Visit
Admission: RE | Admit: 2020-05-31 | Discharge: 2020-05-31 | Disposition: A | Discharge status: Home / Self Care | Payer: No Typology Code available for payment source | Source: Ambulatory Visit | Attending: Radiation Oncology | Admitting: Radiation Oncology

## 2020-05-31 DIAGNOSIS — C50512 Malignant neoplasm of lower-outer quadrant of left female breast: Secondary | ICD-10-CM | POA: Diagnosis not present

## 2020-06-01 ENCOUNTER — Other Ambulatory Visit: Payer: Self-pay

## 2020-06-01 ENCOUNTER — Ambulatory Visit
Admission: RE | Admit: 2020-06-01 | Discharge: 2020-06-01 | Disposition: A | Discharge status: Home / Self Care | Payer: No Typology Code available for payment source | Source: Ambulatory Visit | Attending: Radiation Oncology | Admitting: Radiation Oncology

## 2020-06-01 ENCOUNTER — Ambulatory Visit: Payer: No Typology Code available for payment source

## 2020-06-01 DIAGNOSIS — R293 Abnormal posture: Secondary | ICD-10-CM

## 2020-06-01 DIAGNOSIS — Z483 Aftercare following surgery for neoplasm: Secondary | ICD-10-CM

## 2020-06-01 DIAGNOSIS — C50912 Malignant neoplasm of unspecified site of left female breast: Secondary | ICD-10-CM

## 2020-06-01 DIAGNOSIS — M25512 Pain in left shoulder: Secondary | ICD-10-CM

## 2020-06-01 DIAGNOSIS — C50512 Malignant neoplasm of lower-outer quadrant of left female breast: Secondary | ICD-10-CM | POA: Diagnosis not present

## 2020-06-01 NOTE — Therapy (Signed)
[place], [place], [address] Phone: [phone]   Fax:  [phone]  Physical Therapy Treatment  Patient Details  Name: Susan Daniel MRN: [id number] Date of Birth: 1970-11-07 Referring Provider (PT): Dr. Jana Hakim   Encounter Date: 06/01/2020   PT End of Session - 06/01/20 1546    Visit Number 10    Number of Visits 20    Date for PT Re-Evaluation 06/30/20    PT Start Time 1509    PT Stop Time 1556    PT Time Calculation (min) 47 min    Activity Tolerance Patient tolerated treatment well    Behavior During Therapy [facility] for tasks assessed/performed           Past Medical History:  Diagnosis Date  . Allergy   . Arthritis   . Breast cancer ([place]) 02/10/2020   Left  . Complication of anesthesia    constipation  . Family history of breast cancer   . Family history of rectal cancer     Past Surgical History:  Procedure Laterality Date  . BREAST BIOPSY Left 02/10/2020   LEFT  . BREAST LUMPECTOMY WITH RADIOACTIVE SEED AND SENTINEL LYMPH NODE BIOPSY Left 03/11/2020   Procedure: LEFT BREAST LUMPECTOMY WITH RADIOACTIVE SEED AND LEFT AXILLARY SENTINEL LYMPH NODE BIOPSY WITH BLUE DYE INJECTION;  Surgeon: Donnie Mesa, MD;  Location: [place];  Service: General;  Laterality: Left;  . DILATION AND CURETTAGE OF UTERUS    . RE-EXCISION OF BREAST LUMPECTOMY Left 03/23/2020   Procedure: RE-EXCISION OF INFERIOR MARGIN;  Surgeon: Donnie Mesa, MD;  Location: [place];  Service: General;  Laterality: Left;  . WISDOM TOOTH EXTRACTION      There were no vitals filed for this visit.   Subjective Assessment - 06/01/20 1508    Subjective Radiation is going well.  For the first time I felt a little heated up. A little itchy but the lotion really helps. 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   No present pain.  cords are there but not as painful.  Still have not been fit for  bra.    Pertinent History Pt is s/p left Lumpectomy with SLNB on 03/11/2020 for Invasive Ductal Carcinoma.  She had to have a re-excision performed on 03/23/2020.  ON 03/23/2020 she had a seroma drained that had occured after the first surgery.  She will have to have radiation at some point.    Patient Stated Goals Try to decrease swelling, get full mobility in arm.    Currently in Pain? No/denies    Pain Score 0-No pain                             [facility] Adult PT Treatment/Exercise - 06/01/20 0001      Shoulder Exercises: Standing   Extension Strengthening;Both;15 reps    Theraband Level (Shoulder Extension) Level 1 (Yellow)    Retraction Strengthening;Both;15 reps    Theraband Level (Shoulder Retraction) Level 1 (Yellow)    Other Standing Exercises Jobes flexion and scaption 1# x 10      Shoulder Exercises: Pulleys   Flexion 1 minute    Scaption 1 minute    ABduction 1 minute      Manual Therapy   Soft tissue mobilization left pectorals, left medial and lateral arm after manual work with cocobutter    Myofascial Release to the left  axilla, forearm and arm areas of cording    Passive ROM to the left shoulder into flexion, abduction, horizontal abduction ER,                       PT Long Term Goals - 05/19/20 0809      PT LONG TERM GOAL #1   Title Pt will be independent and compliant with HEP for shoulder ROM/strength    Time 4    Period Weeks    Status Achieved      PT LONG TERM GOAL #2   Title Pt will have left shoulder AROM WNL for improved reaching    Time 6    Period Weeks    Status Achieved      PT LONG TERM GOAL #3   Title Pt will have decreased pain by atleast 50%    Baseline 75% better    Time 6    Period Weeks    Status Achieved      PT LONG TERM GOAL #4   Title Pt will be independent with self MLD prn    Time 5    Period Weeks      PT LONG TERM GOAL #5   Title Quick dash will be no greater than 15% to demonstrate improved  function    Baseline 41%    Time 6    Period Weeks    Status On-going                 Plan - 06/01/20 1546    Clinical Impression Statement Cording continues to be much better and less restricting though can still be seen and palpated..  TEnderness with manual work was much improved also and pt is maintaining good shoulder ROM.  She is having some discomfort in bilateral thumb area likely tendinitis.  She will try a short period  of ice massage bilaterally but only for a minute on the left. She has not got her compression bra but she does not feel swollen presently so we will wait until after completing radiation to see if she needs a compression bra.  Will decrease to 1x per week    Stability/Clinical Decision Making Stable/Uncomplicated    Rehab Potential Excellent    PT Frequency 2x / week    PT Duration 6 weeks    PT Treatment/Interventions ADLs/Self Care Home Management;Therapeutic exercise;Manual techniques;Patient/family education;Manual lymph drainage;Passive range of motion;Vasopneumatic Device;Taping    PT Next Visit Plan MFR to left axillary  and upper arm/forearm cording, PROM left shoulder, STM to scar areas and pectoralis muscles etc, chip pack prn.  progress to strength.  , Hold off on bra for now    PT Home Exercise Plan 4 post op exercises, ABC class, Access Code: [email or web address], supine scapular stabs with yellow x 10, standing theraband exs    Consulted and Agree with Plan of Care Patient           Patient will benefit from skilled therapeutic intervention in order to improve the following deficits and impairments:  Decreased activity tolerance,Decreased knowledge of precautions,Decreased range of motion,Decreased scar mobility,Increased edema,Increased fascial restricitons,Postural dysfunction,Impaired UE functional use,Pain  Visit Diagnosis: Abnormal posture  Acute pain of left shoulder  Aftercare following surgery for neoplasm  Invasive ductal carcinoma of  breast, female, left [facility])     Problem List Patient Active Problem List   Diagnosis Date Noted  . Genetic testing 03/04/2020  . Tobacco abuse 02/26/2020  . Family history  of breast cancer   . Family history of rectal cancer   . Malignant neoplasm of lower-outer quadrant of left breast of female, estrogen receptor positive ([place]) 02/13/2020    Claris Pong 06/01/2020, 4:02 PM  [place], [place], [address] Phone: [phone]   Fax:  [phone]  Name: Susan Daniel MRN: [id number] Date of Birth: [birth year] Cheral Almas, PT 06/01/20 4:03 PM

## 2020-06-02 ENCOUNTER — Ambulatory Visit
Admission: RE | Admit: 2020-06-02 | Discharge: 2020-06-02 | Disposition: A | Discharge status: Home / Self Care | Payer: No Typology Code available for payment source | Source: Ambulatory Visit | Attending: Radiation Oncology | Admitting: Radiation Oncology

## 2020-06-02 DIAGNOSIS — C50512 Malignant neoplasm of lower-outer quadrant of left female breast: Secondary | ICD-10-CM | POA: Diagnosis not present

## 2020-06-03 ENCOUNTER — Ambulatory Visit
Admission: RE | Admit: 2020-06-03 | Discharge: 2020-06-03 | Disposition: A | Discharge status: Home / Self Care | Payer: No Typology Code available for payment source | Source: Ambulatory Visit | Attending: Radiation Oncology | Admitting: Radiation Oncology

## 2020-06-03 DIAGNOSIS — C50512 Malignant neoplasm of lower-outer quadrant of left female breast: Secondary | ICD-10-CM | POA: Diagnosis not present

## 2020-06-04 ENCOUNTER — Ambulatory Visit: Payer: No Typology Code available for payment source | Admitting: Radiation Oncology

## 2020-06-04 ENCOUNTER — Ambulatory Visit: Payer: No Typology Code available for payment source

## 2020-06-04 ENCOUNTER — Other Ambulatory Visit: Payer: Self-pay

## 2020-06-04 ENCOUNTER — Ambulatory Visit
Admission: RE | Admit: 2020-06-04 | Discharge: 2020-06-04 | Disposition: A | Discharge status: Home / Self Care | Payer: No Typology Code available for payment source | Source: Ambulatory Visit | Attending: Radiation Oncology | Admitting: Radiation Oncology

## 2020-06-04 DIAGNOSIS — C50512 Malignant neoplasm of lower-outer quadrant of left female breast: Secondary | ICD-10-CM | POA: Diagnosis not present

## 2020-06-07 ENCOUNTER — Ambulatory Visit
Admission: RE | Admit: 2020-06-07 | Discharge: 2020-06-07 | Disposition: A | Discharge status: Home / Self Care | Payer: No Typology Code available for payment source | Source: Ambulatory Visit | Attending: Radiation Oncology | Admitting: Radiation Oncology

## 2020-06-07 ENCOUNTER — Other Ambulatory Visit: Payer: Self-pay

## 2020-06-07 DIAGNOSIS — C50512 Malignant neoplasm of lower-outer quadrant of left female breast: Secondary | ICD-10-CM | POA: Diagnosis not present

## 2020-06-08 ENCOUNTER — Ambulatory Visit
Admission: RE | Admit: 2020-06-08 | Discharge: 2020-06-08 | Disposition: A | Discharge status: Home / Self Care | Payer: No Typology Code available for payment source | Source: Ambulatory Visit | Attending: Radiation Oncology | Admitting: Radiation Oncology

## 2020-06-08 DIAGNOSIS — C50512 Malignant neoplasm of lower-outer quadrant of left female breast: Secondary | ICD-10-CM | POA: Diagnosis not present

## 2020-06-09 ENCOUNTER — Ambulatory Visit
Admission: RE | Admit: 2020-06-09 | Discharge: 2020-06-09 | Disposition: A | Discharge status: Home / Self Care | Payer: No Typology Code available for payment source | Source: Ambulatory Visit | Attending: Radiation Oncology | Admitting: Radiation Oncology

## 2020-06-09 ENCOUNTER — Other Ambulatory Visit: Payer: Self-pay

## 2020-06-09 DIAGNOSIS — C50512 Malignant neoplasm of lower-outer quadrant of left female breast: Secondary | ICD-10-CM | POA: Diagnosis not present

## 2020-06-10 ENCOUNTER — Ambulatory Visit: Payer: No Typology Code available for payment source

## 2020-06-10 ENCOUNTER — Ambulatory Visit
Admission: RE | Admit: 2020-06-10 | Discharge: 2020-06-10 | Disposition: A | Discharge status: Home / Self Care | Payer: No Typology Code available for payment source | Source: Ambulatory Visit | Attending: Radiation Oncology | Admitting: Radiation Oncology

## 2020-06-10 DIAGNOSIS — Z483 Aftercare following surgery for neoplasm: Secondary | ICD-10-CM

## 2020-06-10 DIAGNOSIS — C50512 Malignant neoplasm of lower-outer quadrant of left female breast: Secondary | ICD-10-CM | POA: Diagnosis not present

## 2020-06-10 DIAGNOSIS — C50912 Malignant neoplasm of unspecified site of left female breast: Secondary | ICD-10-CM

## 2020-06-10 DIAGNOSIS — R293 Abnormal posture: Secondary | ICD-10-CM | POA: Diagnosis not present

## 2020-06-10 DIAGNOSIS — M25512 Pain in left shoulder: Secondary | ICD-10-CM

## 2020-06-10 NOTE — Therapy (Signed)
[place], [place], [address] Phone: [phone]   Fax:  [phone]  Physical Therapy Treatment  Patient Details  Name: Susan Daniel MRN: [id number] Date of Birth: 1970-04-13 Referring Provider (PT): Dr. Jana Hakim   Encounter Date: 06/10/2020   PT End of Session - 06/10/20 2013    Visit Number 11    Number of Visits 20    Date for PT Re-Evaluation 06/30/20    PT Start Time 1504    PT Stop Time 8756    PT Time Calculation (min) 44 min    Activity Tolerance Patient tolerated treatment well    Behavior During Therapy [facility] for tasks assessed/performed           Past Medical History:  Diagnosis Date  . Allergy   . Arthritis   . Breast cancer ([place]) 02/10/2020   Left  . Complication of anesthesia    constipation  . Family history of breast cancer   . Family history of rectal cancer     Past Surgical History:  Procedure Laterality Date  . BREAST BIOPSY Left 02/10/2020   LEFT  . BREAST LUMPECTOMY WITH RADIOACTIVE SEED AND SENTINEL LYMPH NODE BIOPSY Left 03/11/2020   Procedure: LEFT BREAST LUMPECTOMY WITH RADIOACTIVE SEED AND LEFT AXILLARY SENTINEL LYMPH NODE BIOPSY WITH BLUE DYE INJECTION;  Surgeon: Donnie Mesa, MD;  Location: [place];  Service: General;  Laterality: Left;  . DILATION AND CURETTAGE OF UTERUS    . RE-EXCISION OF BREAST LUMPECTOMY Left 03/23/2020   Procedure: RE-EXCISION OF INFERIOR MARGIN;  Surgeon: Donnie Mesa, MD;  Location: [place];  Service: General;  Laterality: Left;  . WISDOM TOOTH EXTRACTION      There were no vitals filed for this visit.   Subjective Assessment - 06/10/20 1505    Subjective I am in the boost phase of radiation and have about 3 more sessions. Skin is a little hot and itchy.  I am using the radioplex.  Feel like I may be a little fuller. I haven't worn the sleeve much.  It feels really tight.  I tried the ice massage on my thumbs and  the right one is a lot better, the left one is a little better.  The wrist and left thumb swelling is better    Pertinent History Pt is s/p left Lumpectomy with SLNB on 03/11/2020 for Invasive Ductal Carcinoma.  She had to have a re-excision performed on 03/23/2020.  ON 03/23/2020 she had a seroma drained that had occured after the first surgery.  She will have to have radiation at some point.    Patient Stated Goals Try to decrease swelling, get full mobility in arm.    Currently in Pain? No/denies    Pain Score 3     Pain Location Breast    Pain Orientation Left    Pain Descriptors / Indicators Burning    Pain Type Acute pain    Pain Onset 1 to 4 weeks ago    Pain Frequency Intermittent                             [facility] Adult PT Treatment/Exercise - 06/10/20 0001      Manual Therapy   Myofascial Release to the left axilla, forearm and arm areas of cording    Manual Lymphatic Drainage (MLD) supraclavicular, Right and left axillary LN's, left inguinal LN, Anterior interaxillary pathway avoiding radiated areas,  left axillo-inguinal pathway avoiding radiated areas, and in SL posterior interaxillary pathway, and axillo-inguinal pathway ending with axillary and left inguinal LN's    Passive ROM to the left shoulder into flexion, abduction, horizontal abduction ER,                       PT Long Term Goals - 05/19/20 0809      PT LONG TERM GOAL #1   Title Pt will be independent and compliant with HEP for shoulder ROM/strength    Time 4    Period Weeks    Status Achieved      PT LONG TERM GOAL #2   Title Pt will have left shoulder AROM WNL for improved reaching    Time 6    Period Weeks    Status Achieved      PT LONG TERM GOAL #3   Title Pt will have decreased pain by atleast 50%    Baseline 75% better    Time 6    Period Weeks    Status Achieved      PT LONG TERM GOAL #4   Title Pt will be independent with self MLD prn    Time 5    Period Weeks       PT LONG TERM GOAL #5   Title Quick dash will be no greater than 15% to demonstrate improved function    Baseline 41%    Time 6    Period Weeks    Status On-going                 Plan - 06/10/20 2015    Clinical Impression Statement Pt is now in Boost portion of radiation anshe is experiencing mild redness and itching of the left breast with slightly more fullness noted at the lateral breast.  Release techniques were performed to left arm areas of cording, PROM was performed to left shoulder, and MLD was performed to LN's and pathways avoiding left breast and irradiated areas. shoulder ROM was well maintained.  Cording is still present but not particularly painful. Pt will return the week after next to allow her skin to recover from radiation.  Will determine need for compression bra, Self MLD then   Stability/Clinical Decision Making Stable/Uncomplicated    Rehab Potential Excellent    PT Frequency 2x / week    PT Duration 6 weeks    PT Treatment/Interventions ADLs/Self Care Home Management;Therapeutic exercise;Manual techniques;Patient/family education;Manual lymph drainage;Passive range of motion;Vasopneumatic Device;Taping    PT Next Visit Plan assess left breast, instruct Self MLD prn, reassess cording and shoulder ROM    PT Home Exercise Plan 4 post op exercises, ABC class, Access Code: [email or web address], supine scapular stabs with yellow x 10, standing theraband exs    Recommended Other Services appt for bra prn, recheck sleeve    Consulted and Agree with Plan of Care Patient           Patient will benefit from skilled therapeutic intervention in order to improve the following deficits and impairments:  Decreased activity tolerance,Decreased knowledge of precautions,Decreased range of motion,Decreased scar mobility,Increased edema,Increased fascial restricitons,Postural dysfunction,Impaired UE functional use,Pain  Visit Diagnosis: Abnormal posture  Acute pain of left  shoulder  Aftercare following surgery for neoplasm  Invasive ductal carcinoma of breast, female, left [facility])     Problem List Patient Active Problem List   Diagnosis Date Noted  . Genetic testing 03/04/2020  . Tobacco abuse 02/26/2020  . Family history  of breast cancer   . Family history of rectal cancer   . Malignant neoplasm of lower-outer quadrant of left breast of female, estrogen receptor positive ([place]) 02/13/2020    Claris Pong 06/10/2020, 8:24 PM  [place], [place], [address] Phone: [phone]   Fax:  [phone]  Name: Susan Daniel MRN: [id number] Date of Birth: 23-Dec-1970 Cheral Almas, PT 06/10/20 8:26 PM

## 2020-06-11 ENCOUNTER — Ambulatory Visit
Admission: RE | Admit: 2020-06-11 | Discharge: 2020-06-11 | Disposition: A | Discharge status: Home / Self Care | Payer: No Typology Code available for payment source | Source: Ambulatory Visit | Attending: Radiation Oncology | Admitting: Radiation Oncology

## 2020-06-11 DIAGNOSIS — C50512 Malignant neoplasm of lower-outer quadrant of left female breast: Secondary | ICD-10-CM | POA: Diagnosis not present

## 2020-06-11 DIAGNOSIS — Z170 Estrogen receptor positive status [ER+]: Secondary | ICD-10-CM

## 2020-06-11 MED ORDER — RADIAPLEXRX EX GEL: Freq: Once | CUTANEOUS | Status: AC

## 2020-06-15 ENCOUNTER — Other Ambulatory Visit: Payer: Self-pay

## 2020-06-15 ENCOUNTER — Encounter: Payer: Self-pay | Admitting: *Deleted

## 2020-06-15 ENCOUNTER — Ambulatory Visit
Admission: RE | Admit: 2020-06-15 | Discharge: 2020-06-15 | Disposition: A | Discharge status: Home / Self Care | Payer: No Typology Code available for payment source | Source: Ambulatory Visit | Attending: Radiation Oncology | Admitting: Radiation Oncology

## 2020-06-15 DIAGNOSIS — C50512 Malignant neoplasm of lower-outer quadrant of left female breast: Secondary | ICD-10-CM | POA: Diagnosis not present

## 2020-06-16 ENCOUNTER — Ambulatory Visit
Admission: RE | Admit: 2020-06-16 | Discharge: 2020-06-16 | Disposition: A | Discharge status: Home / Self Care | Payer: No Typology Code available for payment source | Source: Ambulatory Visit | Attending: Radiation Oncology | Admitting: Radiation Oncology

## 2020-06-16 DIAGNOSIS — C50512 Malignant neoplasm of lower-outer quadrant of left female breast: Secondary | ICD-10-CM | POA: Insufficient documentation

## 2020-06-16 DIAGNOSIS — Z170 Estrogen receptor positive status [ER+]: Secondary | ICD-10-CM | POA: Insufficient documentation

## 2020-06-18 ENCOUNTER — Encounter: Payer: Self-pay | Admitting: *Deleted

## 2020-06-22 ENCOUNTER — Encounter: Payer: Self-pay | Admitting: Radiation Oncology

## 2020-06-22 NOTE — Progress Notes (Signed)
  Patient Name: Susan Daniel MRN: [id number] DOB: Aug 13, 1970 Referring Physician: Lurline Del (Profile Not Attached) Date of Service: 06/16/2020 [facility] [place]                                                        End Of Treatment Note  Diagnoses: C50.512-Malignant neoplasm of lower-outer quadrant of left female breast  Cancer Staging:   Stage IA, pT1cN0M0 grade 2, ER/PR positive invasive ductal (with lobular features) carcinoma of the left breast.  Intent: Curative  Radiation Treatment Dates: 05/19/2020 through 06/16/2020 Site Technique Total Dose (Gy) Dose per Fx (Gy) Completed Fx Beam Energies  Breast, Left: Breast_Lt 3D 42.56/42.56 2.66 16/16 6X  Breast, Left: Breast_Lt_Bst 3D 8/8 2 4/4 6X   Narrative: The patient tolerated radiation therapy relatively well. She developed fatigue and anticipated skin change in the treatment field.  Plan: The patient will receive a call in about one month from the radiation oncology department. She will continue follow up with Dr. Jana Hakim as well.   ________________________________________________    Carola Rhine, [facility]

## 2020-06-29 ENCOUNTER — Other Ambulatory Visit: Payer: Self-pay

## 2020-06-29 ENCOUNTER — Ambulatory Visit: Payer: No Typology Code available for payment source | Attending: Oncology

## 2020-06-29 DIAGNOSIS — Z483 Aftercare following surgery for neoplasm: Secondary | ICD-10-CM | POA: Diagnosis present

## 2020-06-29 DIAGNOSIS — R293 Abnormal posture: Secondary | ICD-10-CM | POA: Insufficient documentation

## 2020-06-29 DIAGNOSIS — M25512 Pain in left shoulder: Secondary | ICD-10-CM | POA: Diagnosis present

## 2020-06-29 DIAGNOSIS — C50912 Malignant neoplasm of unspecified site of left female breast: Secondary | ICD-10-CM | POA: Diagnosis present

## 2020-06-29 NOTE — Therapy (Signed)
[place], [place], [address] Phone: [phone]   Fax:  [phone]  Physical Therapy Treatment  Patient Details  Name: Susan Daniel MRN: [id number] Date of Birth: 1970/11/08 Referring Provider (PT): Dr. Jana Hakim   Encounter Date: 06/29/2020   PT End of Session - 06/29/20 1803     Visit Number 12    Number of Visits 20    Date for PT Re-Evaluation 07/27/20    PT Start Time 1409    PT Stop Time 1506    PT Time Calculation (min) 57 min    Activity Tolerance Patient tolerated treatment well    Behavior During Therapy [facility] for tasks assessed/performed             Past Medical History:  Diagnosis Date   Allergy    Arthritis    Breast cancer ([place]) 33/54/5625   Left   Complication of anesthesia    constipation   Family history of breast cancer    Family history of rectal cancer     Past Surgical History:  Procedure Laterality Date   BREAST BIOPSY Left 02/10/2020   LEFT   BREAST LUMPECTOMY WITH RADIOACTIVE SEED AND SENTINEL LYMPH NODE BIOPSY Left 03/11/2020   Procedure: LEFT BREAST LUMPECTOMY WITH RADIOACTIVE SEED AND LEFT AXILLARY SENTINEL LYMPH NODE BIOPSY WITH BLUE DYE INJECTION;  Surgeon: Donnie Mesa, MD;  Location: [place];  Service: General;  Laterality: Left;   DILATION AND CURETTAGE OF UTERUS     RE-EXCISION OF BREAST LUMPECTOMY Left 03/23/2020   Procedure: RE-EXCISION OF INFERIOR MARGIN;  Surgeon: Donnie Mesa, MD;  Location: [facility];  Service: General;  Laterality: Left;   WISDOM TOOTH EXTRACTION      There were no vitals filed for this visit.   Subjective Assessment - 06/29/20 1410     Subjective Radiation ended June 1st.  Skin got really bad on the last few.  Had a rash and a little swelling still and the nipple still looks a little swollen and a little swollen at lateral trunk. Thumb is still swollen on the left.                [facility] PT Assessment  - 06/29/20 0001       Assessment   Medical Diagnosis Left Invasive Ductal Carcinoma    Referring Provider (PT) Dr. Jana Hakim    Onset Date/Surgical Date 03/11/20    Hand Dominance Right      Precautions   Precaution Comments lymphedema risk      AROM   Left Shoulder Extension 63 Degrees    Left Shoulder Flexion 170 Degrees    Left Shoulder ABduction 180 Degrees               LYMPHEDEMA/ONCOLOGY QUESTIONNAIRE - 06/29/20 0001       Right Upper Extremity Lymphedema   15 cm Proximal to Olecranon Process 28.7 cm    10 cm Proximal to Olecranon Process 27.5 cm    Olecranon Process 25.8 cm    10 cm Proximal to Ulnar Styloid Process 20.3 cm    Just Proximal to Ulnar Styloid Process 16.4 cm    At Base of 2nd Digit 6.5 cm      Left Upper Extremity Lymphedema   15 cm Proximal to Olecranon Process 28.8 cm    10 cm Proximal to Olecranon Process 27.5 cm    Olecranon Process 24.8 cm    10 cm Proximal to Ulnar Styloid Process  20.4 cm    Just Proximal to Ulnar Styloid Process 16.7 cm    At [facility] of 2nd Digit 6.4 cm                Quick Dash - 06/29/20 0001     Open a tight or new jar Mild difficulty    Do heavy household chores (wash walls, wash floors) No difficulty    Carry a shopping bag or briefcase Mild difficulty    Wash your back No difficulty    Use a knife to cut food No difficulty    Recreational activities in which you take some force or impact through your arm, shoulder, or hand (golf, hammering, tennis) Mild difficulty    During the past week, to what extent has your arm, shoulder or hand problem interfered with your normal social activities with family, friends, neighbors, or groups? Slightly    During the past week, to what extent has your arm, shoulder or hand problem limited your work or other regular daily activities Slightly    Arm, shoulder, or hand pain. Mild    Tingling (pins and needles) in your arm, shoulder, or hand None    Difficulty Sleeping No  difficulty    DASH Score 13.64 %                    [facility] Adult PT Treatment/Exercise - 06/29/20 0001       Manual Therapy   Manual therapy comments Pt remeasured    Edema Management Pt was educated in proper way to don sleeve and gauntlet.    Manual Lymphatic Drainage (MLD) supraclavicular, Right and left axillary LN's, left inguinal LN, Anterior interaxillary pathway , left axillo-inguinal pathway, and left breast redirecting to appropriate pathways and retracing steps ending with LN's.  Pt was given illustrated and written instructions.                         PT Long Term Goals - 06/29/20 1428       PT LONG TERM GOAL #1   Title Pt will be independent and compliant with HEP for shoulder ROM/strength    Time 4    Period Weeks    Status Achieved      PT LONG TERM GOAL #2   Title Pt will have left shoulder AROM WNL for improved reaching    Time 6    Period Weeks    Status Achieved      PT LONG TERM GOAL #3   Title Pt will have decreased pain by atleast 50%    Baseline 75% better    Time 6    Period Weeks    Status Achieved      PT LONG TERM GOAL #4   Title Pt will be independent with self MLD prn    Time 5    Status On-going      PT LONG TERM GOAL #5   Title Quick dash will be no greater than 15% to demonstrate improved function    Baseline 13% today    Time 6    Period Weeks    Status Achieved                   Plan - 06/29/20 1804     Clinical Impression Statement Pt was reassessed today after completing her left breast radiation.  Her shoulder ROM continues to be excellent and although mild cording is still present it  does not interfere with her function.  She does still have visible thumb swelling and she may benefit from wrapping with elastomull.  She does appear to have slight left breast and lateral trunk swelling and she requires further instruction in MLD and a compression bra to take care of this.  She was given a script  to get compression bra at [employer]. Pt was instructed in proper way to don/doff her sleeve and gauntlet again and they appear to fit well. She will benefit from further therapy to address deficits and return to [facility]    Stability/Clinical Decision Making Stable/Uncomplicated    Rehab Potential Excellent    PT Frequency 1x / week    PT Duration 4 weeks    PT Treatment/Interventions ADLs/Self Care Home Management;Therapeutic exercise;Manual techniques;Patient/family education;Manual lymph drainage;Passive range of motion;Vasopneumatic Device;Taping    PT Next Visit Plan review left breast MLD, reassess cords, progress strength, wrap thumb and index with elastomull?    PT Home Exercise Plan 4 post op exercises, ABC class, Access Code: [email or web address], supine scapular stabs with yellow x 10, standing theraband exs    Recommended Other Services gave script again for bra,    Consulted and Agree with Plan of Care Patient             Patient will benefit from skilled therapeutic intervention in order to improve the following deficits and impairments:  Decreased activity tolerance, Decreased knowledge of precautions, Decreased range of motion, Decreased scar mobility, Increased edema, Increased fascial restricitons, Postural dysfunction, Impaired UE functional use, Pain  Visit Diagnosis: Abnormal posture  Acute pain of left shoulder  Aftercare following surgery for neoplasm  Invasive ductal carcinoma of breast, female, left [facility])     Problem List Patient Active Problem List   Diagnosis Date Noted   Genetic testing 03/04/2020   Tobacco abuse 02/26/2020   Family history of breast cancer    Family history of rectal cancer    Malignant neoplasm of lower-outer quadrant of left breast of female, estrogen receptor positive ([place]) 02/13/2020    Claris Pong 06/29/2020, 6:12 PM  [place], [place],  [address] Phone: [phone]   Fax:  [phone]  Name: Susan Daniel MRN: [id number] Date of Birth: November 25, 1970 Cheral Almas, PT 06/29/20 6:14 PM

## 2020-07-06 ENCOUNTER — Other Ambulatory Visit: Payer: Self-pay

## 2020-07-06 ENCOUNTER — Ambulatory Visit: Payer: No Typology Code available for payment source

## 2020-07-06 DIAGNOSIS — Z483 Aftercare following surgery for neoplasm: Secondary | ICD-10-CM

## 2020-07-06 DIAGNOSIS — R293 Abnormal posture: Secondary | ICD-10-CM

## 2020-07-06 DIAGNOSIS — M25512 Pain in left shoulder: Secondary | ICD-10-CM

## 2020-07-06 DIAGNOSIS — C50912 Malignant neoplasm of unspecified site of left female breast: Secondary | ICD-10-CM

## 2020-07-06 NOTE — Therapy (Signed)
[place], [place], [address] Phone: [phone]   Fax:  [phone]  Physical Therapy Treatment  Patient Details  Name: Susan Daniel MRN: [id number] Date of Birth: 08-10-1970 Referring Provider (PT): Dr. Jana Hakim   Encounter Date: 07/06/2020   PT End of Session - 07/06/20 1528     Visit Number 13    Number of Visits 20    Date for PT Re-Evaluation 07/27/20    PT Start Time 3716    PT Stop Time 1510    PT Time Calculation (min) 60 min    Activity Tolerance Patient tolerated treatment well    Behavior During Therapy [facility] for tasks assessed/performed             Past Medical History:  Diagnosis Date   Allergy    Arthritis    Breast cancer ([place]) 96/78/9381   Left   Complication of anesthesia    constipation   Family history of breast cancer    Family history of rectal cancer     Past Surgical History:  Procedure Laterality Date   BREAST BIOPSY Left 02/10/2020   LEFT   BREAST LUMPECTOMY WITH RADIOACTIVE SEED AND SENTINEL LYMPH NODE BIOPSY Left 03/11/2020   Procedure: LEFT BREAST LUMPECTOMY WITH RADIOACTIVE SEED AND LEFT AXILLARY SENTINEL LYMPH NODE BIOPSY WITH BLUE DYE INJECTION;  Surgeon: Donnie Mesa, MD;  Location: [facility];  Service: General;  Laterality: Left;   DILATION AND CURETTAGE OF UTERUS     RE-EXCISION OF BREAST LUMPECTOMY Left 03/23/2020   Procedure: RE-EXCISION OF INFERIOR MARGIN;  Surgeon: Donnie Mesa, MD;  Location: [place];  Service: General;  Laterality: Left;   WISDOM TOOTH EXTRACTION      There were no vitals filed for this visit.   Subjective Assessment - 07/06/20 1412     Subjective my thumb and finger seem less swollen and not as tender.  I have been doing MLD but need to be sure I am doing it right. Haven't made my appt for bra yet. Cording still feels pretty good.   lateral breast is still a little tender and a little swollen. I pulled weeds  yesterday and my thighs are really sore today.    Pertinent History Pt is s/p left Lumpectomy with SLNB on 03/11/2020 for Invasive Ductal Carcinoma.  She had to have a re-excision performed on 03/23/2020.  ON 03/23/2020 she had a seroma drained that had occured after the first surgery.  She will have to have radiation at some point.    Patient Stated Goals Try to decrease swelling, get full mobility in arm.    Currently in Pain? No/denies    Pain Score 0-No pain                    L-DEX FLOWSHEETS - 07/06/20 1500       L-DEX LYMPHEDEMA SCREENING   Measurement Type Unilateral    L-DEX MEASUREMENT EXTREMITY Upper Extremity    POSITION  Standing    DOMINANT SIDE Right    At Risk Side Left    BASELINE SCORE (UNILATERAL) 3.1                       [facility] Adult PT Treatment/Exercise - 07/06/20 0001       Manual Therapy   Edema Management Pt was educated in thumb and index finger wrapping with elastomull    Manual Lymphatic Drainage (MLD) Reviewed Breast MLD  with ZO:[id number], Right and left axillary LN's, left inguinal LN, Anterior interaxillary pathway , left axillo-inguinal pathway, and left breast redirecting to appropriate pathways and retracing steps ending with LN's.  Instructed in MLD to left UE secondary to continued swelling at wrist, index finger and thumb, Pt was given  written instructions.                    PT Education - 07/06/20 1527     Education Details Pt was educated in thumb and index finger wrapping with elastomull and left UE MLD to use prn for wrist, thumb, index finger swelling    Person(s) Educated Patient    Methods Demonstration;Handout;Verbal cues    Comprehension Returned demonstration;Verbal cues required                 PT Long Term Goals - 06/29/20 1428       PT LONG TERM GOAL #1   Title Pt will be independent and compliant with HEP for shoulder ROM/strength    Time 4    Period Weeks    Status Achieved       PT LONG TERM GOAL #2   Title Pt will have left shoulder AROM WNL for improved reaching    Time 6    Period Weeks    Status Achieved      PT LONG TERM GOAL #3   Title Pt will have decreased pain by atleast 50%    Baseline 75% better    Time 6    Period Weeks    Status Achieved      PT LONG TERM GOAL #4   Title Pt will be independent with self MLD prn    Time 5    Status On-going      PT LONG TERM GOAL #5   Title Quick dash will be no greater than 15% to demonstrate improved function    Baseline 13% today    Time 6    Period Weeks    Status Achieved                   Plan - 07/06/20 1529     Clinical Impression Statement Pt continues with some left lateral breast and trunk swelling as well as improved thumb swelling but still present in thumb and index finger. We reviewed MLD for the left breast, and pt was also instructed in MLD to the left UE for intermittent swelling.  We also practiced wrapping the thumb and index finger and pt was given several rolls of elastomull to use at home prn.  We did a SOZO screen despitee not having a pre-surgical baseline, and have set her up again for 3 months.    Stability/Clinical Decision Making Stable/Uncomplicated    Rehab Potential Excellent    PT Frequency 1x / week    PT Duration 4 weeks    PT Treatment/Interventions ADLs/Self Care Home Management;Therapeutic exercise;Manual techniques;Patient/family education;Manual lymph drainage;Passive range of motion;Vasopneumatic Device;Taping    PT Next Visit Plan review left breast MLD,Left UE MLD, finger wrapping thumb/idex prn. reassess cords, progress strength, wrap thumb and index with elastomull?    PT Home Exercise Plan 4 post op exercises, ABC class, Access Code: [email or web address], supine scapular stabs with yellow x 10, standing theraband exs, breast MLD, Left UE MLD, finger wrap prn    Consulted and Agree with Plan of Care Patient             Patient will benefit from  skilled  therapeutic intervention in order to improve the following deficits and impairments:  Decreased activity tolerance, Decreased knowledge of precautions, Decreased range of motion, Decreased scar mobility, Increased edema, Increased fascial restricitons, Postural dysfunction, Impaired UE functional use, Pain  Visit Diagnosis: Abnormal posture  Acute pain of left shoulder  Aftercare following surgery for neoplasm  Invasive ductal carcinoma of breast, female, left [facility])     Problem List Patient Active Problem List   Diagnosis Date Noted   Genetic testing 03/04/2020   Tobacco abuse 02/26/2020   Family history of breast cancer    Family history of rectal cancer    Malignant neoplasm of lower-outer quadrant of left breast of female, estrogen receptor positive ([place]) 02/13/2020    Claris Pong 07/06/2020, 3:37 PM  [place], [place], [address] Phone: [phone]   Fax:  [phone]  Name: Susan Daniel MRN: [id number] Date of Birth: [birth year] Cheral Almas, PT 07/06/20 3:39 PM

## 2020-07-06 NOTE — Patient Instructions (Signed)
Pt was given written instructions for finger wrapping and left UE MLD for swelling that occurs in wrist, thumb, and index finger

## 2020-07-19 NOTE — Progress Notes (Signed)
[place]  [phone] [phone]) [phone]     ID: Susan Daniel DOB: 19-Nov-1970  MR#: [id number]  DVV#:[id number]  Patient Care Team: Wendie Agreste, MD as PCP - General (Family Medicine) Mauro Kaufmann, RN as Oncology Nurse Navigator Rockwell Germany, RN as Oncology Nurse Navigator Everlene Farrier, MD as Consulting Physician (Obstetrics and Gynecology) Donnie Mesa, MD as Consulting Physician (General Surgery) Kyung Rudd, MD as Consulting Physician (Radiation Oncology) Carli Lefevers, Virgie Dad, MD as Consulting Physician (Oncology) Aurea Graff OTHER MD:  CHIEF COMPLAINT: estrogen receptor positive breast cancer  CURRENT TREATMENT: [tamoxifen]   INTERVAL HISTORY: Susan Daniel rwas scheduled today for follow up of her estrogen receptor positive breast cancer.   Since her last visit, she received radiation therapy under Dr. Lisbeth Renshaw from 05/19/2020 through 06/16/2020.  She is now ready to begin tamoxifen. Recall she has an IUD in place.   REVIEW OF SYSTEMS: Susan Daniel    COVID 19 VACCINATION STATUS: Never vaccinated; may have had Covid in January 2020 but not tested   HISTORY OF CURRENT ILLNESS: From the original intake note:  Susan Daniel had routine screening mammography on showing a possible abnormality in the left breast. She underwent left diagnostic mammography with tomography and left breast ultrasonography at [facility] on 02/09/2020 showing: breast density category C; 0.8 cm spiculated mass in left breast at 6 o'clock; 1.4 cm hypoechoic mass in left breast at 5:30, unchanged compared to prior ultrasound in 08/2017; ultrasound of left axilla is negative.  Accordingly on 02/10/2020 she proceeded to biopsy of the left breast area in question. 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 Prognostic indicators significant for: estrogen  receptor, 80% positive with moderate-strong staining intensity and progesterone receptor, 100% positive with strong staining intensity. Proliferation marker Ki67 at 10%. HER2 negative by immunohistochemistry (1+).  Cancer Staging Malignant neoplasm of lower-outer quadrant of left breast of female, estrogen receptor positive ([employer]) Staging form: Breast, AJCC 8th Edition - Clinical stage from 02/17/2020: Stage IA (cT1b, cN0, cM0, G2, ER+, PR+, HER2+) - Signed by Chauncey Cruel, MD on 02/26/2020 Stage prefix: Initial diagnosis Method of lymph node assessment: Clinical - Pathologic stage from 03/23/2020: Stage IA (pT1c, pN0, cM0, G1, ER+, PR+, HER2-) - Signed by Gardenia Phlegm, NP on 03/31/2020 Histologic grading system: 3 grade system   The patient's subsequent history is as detailed below.   PAST MEDICAL HISTORY: Past Medical History:  Diagnosis Date   Allergy    Arthritis    Breast cancer ([place]) 62/69/4854   Left   Complication of anesthesia    constipation   Family history of breast cancer    Family history of rectal cancer     PAST SURGICAL HISTORY: Past Surgical History:  Procedure Laterality Date   BREAST BIOPSY Left 02/10/2020   LEFT   BREAST LUMPECTOMY WITH RADIOACTIVE SEED AND SENTINEL LYMPH NODE BIOPSY Left 03/11/2020   Procedure: LEFT BREAST LUMPECTOMY WITH RADIOACTIVE SEED AND LEFT AXILLARY SENTINEL LYMPH NODE BIOPSY WITH BLUE DYE INJECTION;  Surgeon: Donnie Mesa, MD;  Location: [place];  Service: General;  Laterality: Left;   DILATION AND CURETTAGE OF UTERUS     RE-EXCISION OF BREAST LUMPECTOMY Left 03/23/2020   Procedure: RE-EXCISION OF INFERIOR MARGIN;  Surgeon: Donnie Mesa, MD;  Location: [place];  Service: General;  Laterality: Left;   WISDOM TOOTH EXTRACTION      FAMILY HISTORY: Family  History  Problem Relation Age of Onset   Heart disease Paternal Grandfather    Aneurysm Paternal Grandfather    Diabetes Maternal Grandmother     Heart disease Maternal Grandmother    Breast cancer Mother 56       second diagnosis at age 38   Kidney disease Maternal Grandfather    Irritable bowel syndrome Other        Cousins   Kidney disease Maternal Uncle    Breast cancer Cousin        dx late 30s/40s; Maternal 1st cousin, daughter of pt's mother's brother   Rectal cancer Maternal Aunt        dx late 30s/40s   Her father died at age 39 shortly after the patient's mother died at age 82 from from what may have been complications of an autologous transplant at [facility] for her breast cancer.. She had a history of breast cancer twice, first at age 49 then again at age 33. Susan Daniel is an only child. For full family information, see genetic counseling note from 02/24/2020.   GYNECOLOGIC HISTORY:  No LMP recorded. (Menstrual status: IUD). Menarche: 58 or 50 years old Age at first live birth: 50 years old, after multiple miscarriages GX P 1 LMP Mirena in place HRT no  Hysterectomy? no BSO? no   SOCIAL HISTORY: (updated 02/2020)  Misako works in Press photographer.  Her husband [name] is an Chief Financial Officer.  Their daughter Westly Pam is 89.  The patient in addition has a blue healer and a [place] bulldog at home.  She is a Psychologist, forensic    ADVANCED DIRECTIVES: In the absence of any documents to the contrary the patient's husband is her healthcare power of attorney   HEALTH MAINTENANCE: Social History   Tobacco Use   Smoking status: Every Day    Packs/day: 0.50    Pack years: 0.00    Types: Cigarettes   Smokeless tobacco: Never  Vaping Use   Vaping Use: Some days  Substance Use Topics   Alcohol use: Yes    Comment: ocassionally   Drug use: No     Colonoscopy: n/a  PAP: Up-to-date  Bone density: Never   Allergies  Allergen Reactions   Codeine Nausea And Vomiting   Hydrocodone Nausea And Vomiting   Sulfa Antibiotics Hives    Current Outpatient Medications  Medication Sig Dispense Refill   ibuprofen (ADVIL,MOTRIN) 200 MG tablet Take 400 mg by  mouth every 6 (six) hours as needed for moderate pain.     levonorgestrel (MIRENA) 20 MCG/24HR IUD 1 each by Intrauterine route once.     Multiple Vitamin (MULTIVITAMIN WITH MINERALS) TABS tablet Take 1 tablet by mouth 3 (three) times a week.     traMADol (ULTRAM) 50 MG tablet Take 1 tablet (50 mg total) by mouth every 6 (six) hours as needed for moderate pain or severe pain. (Patient not taking: Reported on 04/28/2020) 12 tablet 0   No current facility-administered medications for this visit.    OBJECTIVE: White woman who appears well  There were no vitals filed for this visit.    There is no height or weight on file to calculate BMI.   Wt Readings from Last 3 Encounters:  05/18/20 147 lb 6.4 oz (66.9 kg)  04/28/20 146 lb 6.4 oz (66.4 kg)  03/23/20 145 lb 4.5 oz (65.9 kg)      ECOG FS:1 - Symptomatic but completely ambulatory   LAB RESULTS:  CMP     Component Value Date/Time  NA 141 02/26/2020 1547   K 4.3 02/26/2020 1547   CL 108 02/26/2020 1547   CO2 26 02/26/2020 1547   GLUCOSE 88 02/26/2020 1547   BUN 15 02/26/2020 1547   CREATININE 0.92 02/26/2020 1547   CREATININE 0.73 10/25/2012 1220   CALCIUM 9.0 02/26/2020 1547   PROT 6.4 (L) 02/26/2020 1547   ALBUMIN 3.8 02/26/2020 1547   AST 14 (L) 02/26/2020 1547   ALT 15 02/26/2020 1547   ALKPHOS 66 02/26/2020 1547   BILITOT 0.2 (L) 02/26/2020 1547   GFRNONAA >60 02/26/2020 1547   GFRAA  12/05/2007 0529    >60        The eGFR has been calculated using the MDRD equation. This calculation has not been validated in all clinical    No results found for: TOTALPROTELP, ALBUMINELP, A1GS, A2GS, BETS, BETA2SER, GAMS, MSPIKE, SPEI  Lab Results  Component Value Date   WBC 6.8 02/26/2020   NEUTROABS 4.0 02/26/2020   HGB 14.0 02/26/2020   HCT 42.6 02/26/2020   MCV 94.7 02/26/2020   PLT 254 02/26/2020    No results found for: LABCA2  No components found for: [id number]  No results for input(s): INR in the last 168  hours.  No results found for: LABCA2  No results found for: BSW967  No results found for: RFF638  No results found for: GYK599  No results found for: CA2729  No components found for: HGQUANT  No results found for: CEA1 / No results found for: CEA1   No results found for: AFPTUMOR  No results found for: CHROMOGRNA  No results found for: KPAFRELGTCHN, LAMBDASER, KAPLAMBRATIO (kappa/lambda light chains)  No results found for: HGBA, HGBA2QUANT, HGBFQUANT, HGBSQUAN (Hemoglobinopathy evaluation)   Lab Results  Component Value Date   LDH 120 12/05/2007    No results found for: IRON, TIBC, IRONPCTSAT (Iron and TIBC)  No results found for: FERRITIN  Urinalysis No results found for: COLORURINE, APPEARANCEUR, LABSPEC, PHURINE, GLUCOSEU, HGBUR, BILIRUBINUR, KETONESUR, PROTEINUR, UROBILINOGEN, NITRITE, LEUKOCYTESUR   STUDIES: No results found.   ELIGIBLE FOR AVAILABLE RESEARCH PROTOCOL: No  ASSESSMENT: 50 y.o. Susan Daniel woman status post left breast lower outer quadrant biopsy 02/10/2020 for a clinical T1b N0, stage IA invasive ductal carcinoma, grade 2, estrogen and progesterone receptor positive, HER-2 not amplified, with an MIB-1 of 10%.  (1) genetics testing 03/01/2018 to 03/10/2020 through the  [facility] panel and [facility] RNA[employer] panel found no deleterious mutations in ATM, BRCA1, BRCA2, CDH1, CHEK2, PALB2, PTEN, and TP53;  APC, ATM, AXIN2, BARD1, BMPR1A, BRCA1, BRCA2, BRIP1, CDH1, CDK4, CDKN2A, CHEK2, DICER1, EPCAM, GREM1, HOXB13, MEN1, MLH1, MSH2, MSH3, MSH6, MUTYH, NBN, NF1, NF2, NTHL1, PALB2, PMS2, POLD1, POLE, PTEN, RAD51C, RAD51D, RECQL, RET, SDHA, SDHAF2, SDHB, SDHC, SDHD, SMAD4, SMARCA4, STK11, TP53, TSC1, TSC2, and VHL.  RNA data is routinely analyzed for use in variant interpretation for all genes.  (2) left lumpectomy 03/11/2020 showed a pT1c pN0, stage IA  invasive ductal carcinoma, grade 2, with positive margins.  (a) a total of 3 left  axillary lymph nodes were removed  (b) additional surgery 03/24/2018 cleared the margins  (3) Oncotype score of 20 predicts a risk of recurrence outside the breast within the next 9 years of 6% if the patient's only systemic therapy is antiestrogens for 5 years.  It also predicts no benefit from chemotherapy.  (4) adjuvant radiation 05/19/2020 through 06/16/2020 Site Technique Total Dose (Gy) Dose per Fx (Gy) Completed Fx Beam Energies  Breast, Left: Breast_Lt 3D 42.56/42.56  2.66 16/16 6X  Breast, Left: Breast_Lt_Bst 3D 8/8 2 4/4 6X   (5) to start tamoxifen after completion of local treatment  (a) Mirena IUD in place   PLAN: Jasiyah was scheeduled for a visit today 07/20/2020 but she did not show.  Follow-up letter has been sent.   Virgie Dad. Geral Coker, MD 07/19/2020 1:54 PM Medical Oncology and Hematology [facility] [place], [place] [address] Tel. [phone]    Fax. [phone]   This document serves as a record of services personally performed by Lurline Del, MD. It was created on his behalf by Wilburn Mylar, a trained medical scribe. The creation of this record is based on the scribe's personal observations and the provider's statements to them.   I, Lurline Del MD, have reviewed the above documentation for accuracy and completeness, and I agree with the above.   *Total Encounter Time as defined by the Centers for Medicare and Medicaid Services includes, in addition to the face-to-face time of a patient visit (documented in the note above) non-face-to-face time: obtaining and reviewing outside history, ordering and reviewing medications, tests or procedures, care coordination (communications with other health care professionals or caregivers) and documentation in the medical record.

## 2020-07-20 ENCOUNTER — Inpatient Hospital Stay: Payer: No Typology Code available for payment source | Attending: Oncology

## 2020-07-20 ENCOUNTER — Inpatient Hospital Stay (HOSPITAL_BASED_OUTPATIENT_CLINIC_OR_DEPARTMENT_OTHER): Payer: No Typology Code available for payment source | Admitting: Oncology

## 2020-07-20 ENCOUNTER — Encounter: Payer: Self-pay | Admitting: Oncology

## 2020-07-20 DIAGNOSIS — C50512 Malignant neoplasm of lower-outer quadrant of left female breast: Secondary | ICD-10-CM

## 2020-07-20 DIAGNOSIS — Z170 Estrogen receptor positive status [ER+]: Secondary | ICD-10-CM

## 2020-08-09 ENCOUNTER — Other Ambulatory Visit: Payer: Self-pay

## 2020-08-09 ENCOUNTER — Ambulatory Visit
Admission: RE | Admit: 2020-08-09 | Discharge: 2020-08-09 | Disposition: A | Discharge status: Home / Self Care | Payer: No Typology Code available for payment source | Source: Ambulatory Visit | Attending: Oncology | Admitting: Oncology

## 2020-08-09 DIAGNOSIS — C50512 Malignant neoplasm of lower-outer quadrant of left female breast: Secondary | ICD-10-CM

## 2020-08-09 DIAGNOSIS — Z170 Estrogen receptor positive status [ER+]: Secondary | ICD-10-CM

## 2020-08-09 NOTE — Progress Notes (Signed)
  Radiation Oncology         [phone] ________________________________  Name: Susan Daniel MRN: [id number]  Date of Service: 08/09/2020  DOB: 03-17-1970  Post Treatment Telephone Note  Diagnosis:   Stage IA, pT1cN0M0 grade 2, ER/PR positive invasive ductal (with lobular features) carcinoma of the left breast.  Interval Since Last Radiation:  9 weeks   05/19/2020 through 06/16/2020 Site Technique Total Dose (Gy) Dose per Fx (Gy) Completed Fx Beam Energies  Breast, Left: Breast_Lt 3D 42.56/42.56 2.66 16/16 6X  Breast, Left: Breast_Lt_Bst 3D 8/8 2 4/4 6X   Narrative:  The patient was contacted today for routine follow-up. During treatment she did very well with radiotherapy and did not have significant desquamation. She reports she is doing well with some sensitivity still noticable.  Impression/Plan: 1. 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 The patient has been doing well since completion of radiotherapy. We discussed that we would be happy to continue to follow her as needed, but she will also continue to follow up with Dr. Jana Hakim in medical oncology. She was counseled on skin care as well as measures to avoid sun exposure to this area.  2. Survivorship. We discussed the importance of survivorship evaluation and encouraged her to attend her upcoming visit with that clinic.       Carola Rhine, PAC

## 2020-10-11 ENCOUNTER — Ambulatory Visit: Payer: No Typology Code available for payment source | Attending: Oncology

## 2020-10-11 ENCOUNTER — Other Ambulatory Visit: Payer: Self-pay

## 2020-10-11 VITALS — Wt 151.0 lb

## 2020-10-11 DIAGNOSIS — Z483 Aftercare following surgery for neoplasm: Secondary | ICD-10-CM | POA: Insufficient documentation

## 2020-10-11 NOTE — Therapy (Signed)
[place], [place], [address] Phone: [phone]   Fax:  [phone]  Physical Therapy Treatment  Patient Details  Name: Susan Daniel MRN: [id number] Date of Birth: 1970-11-20 Referring Provider (PT): Dr. Jana Hakim   Encounter Date: 10/11/2020   PT End of Session - 10/11/20 1420     Visit Number 13   # unchanged due to screen only   PT Start Time 1415    PT Stop Time 1421    PT Time Calculation (min) 6 min    Activity Tolerance Patient tolerated treatment well    Behavior During Therapy [facility] for tasks assessed/performed             Past Medical History:  Diagnosis Date   Allergy    Arthritis    Breast cancer ([place]) 01/77/9390   Left   Complication of anesthesia    constipation   Family history of breast cancer    Family history of rectal cancer     Past Surgical History:  Procedure Laterality Date   BREAST BIOPSY Left 02/10/2020   LEFT   BREAST LUMPECTOMY WITH RADIOACTIVE SEED AND SENTINEL LYMPH NODE BIOPSY Left 03/11/2020   Procedure: LEFT BREAST LUMPECTOMY WITH RADIOACTIVE SEED AND LEFT AXILLARY SENTINEL LYMPH NODE BIOPSY WITH BLUE DYE INJECTION;  Surgeon: Donnie Mesa, MD;  Location: [place];  Service: General;  Laterality: Left;   DILATION AND CURETTAGE OF UTERUS     RE-EXCISION OF BREAST LUMPECTOMY Left 03/23/2020   Procedure: RE-EXCISION OF INFERIOR MARGIN;  Surgeon: Donnie Mesa, MD;  Location: [place];  Service: General;  Laterality: Left;   WISDOM TOOTH EXTRACTION      Vitals:   10/11/20 1417  Weight: 151 lb (68.5 kg)     Subjective Assessment - 10/11/20 1418     Subjective Pt returns for her 3 month L-Dex screen. "I've made appts to return because of my breast swelling."    Pertinent History Pt is s/p left Lumpectomy with SLNB on 03/11/2020 for Invasive Ductal Carcinoma.  She had to have a re-excision performed on 03/23/2020.  ON 03/23/2020 she had a seroma  drained that had occured after the first surgery.  She will have to have radiation at some point.                    L-DEX FLOWSHEETS - 10/11/20 1400       L-DEX LYMPHEDEMA SCREENING   Measurement Type Unilateral    L-DEX MEASUREMENT EXTREMITY Upper Extremity    POSITION  Standing    DOMINANT SIDE Right    At Risk Side Left    BASELINE SCORE (UNILATERAL) 3.1    L-DEX SCORE (UNILATERAL) 0.8    VALUE CHANGE (UNILAT) -2.3                                     PT Long Term Goals - 06/29/20 1428       PT LONG TERM GOAL #1   Title Pt will be independent and compliant with HEP for shoulder ROM/strength    Time 4    Period Weeks    Status Achieved      PT LONG TERM GOAL #2   Title Pt will have left shoulder AROM WNL for improved reaching    Time 6    Period Weeks    Status Achieved  PT LONG TERM GOAL #3   Title Pt will have decreased pain by atleast 50%    Baseline 75% better    Time 6    Period Weeks    Status Achieved      PT LONG TERM GOAL #4   Title Pt will be independent with self MLD prn    Time 5    Status On-going      PT LONG TERM GOAL #5   Title Quick dash will be no greater than 15% to demonstrate improved function    Baseline 13% today    Time 6    Period Weeks    Status Achieved                   Plan - 10/11/20 1422     Clinical Impression Statement Pt returns for her 3 month L-Dex screen. Her change from baseline of -2.3 is WNLs so pt only needs to cont every 3 month L-Dex screens which pt is agreeable to. She is returning for physical therapy though for her Lt breast swelling that she reports has flared up over the summer.    PT Next Visit Plan Eval Lt breast swelling and A/ROM. Cont every 3 month L-Dex screens for up to 2 years form her SLNB. (~03/11/2022)    Consulted and Agree with Plan of Care Patient             Patient will benefit from skilled therapeutic intervention in order to improve  the following deficits and impairments:     Visit Diagnosis: Aftercare following surgery for neoplasm     Problem List Patient Active Problem List   Diagnosis Date Noted   Genetic testing 03/04/2020   Tobacco abuse 02/26/2020   Family history of breast cancer    Family history of rectal cancer    Malignant neoplasm of lower-outer quadrant of left breast of female, estrogen receptor positive ([place]) 02/13/2020    Otelia Limes, PTA 10/11/2020, 2:24 PM  [facility] [place], [place], [address] Phone: [phone]   Fax:  [phone]  Name: Susan Daniel MRN: [id number] Date of Birth: October 13, 1970

## 2020-10-28 ENCOUNTER — Ambulatory Visit: Payer: No Typology Code available for payment source

## 2020-11-07 NOTE — Progress Notes (Signed)
Susan Daniel  [phone] [phone]     ID: Susan Daniel DOB: 1970-06-17  MR#: [id number]  GUR#:[id number]  Patient Care Team: Susan Agreste, MD as PCP - General (Family Medicine) Susan Kaufmann, RN as Oncology Nurse Navigator Susan Germany, RN as Oncology Nurse Navigator Susan Farrier, MD as Consulting Physician (Obstetrics and Gynecology) Susan Mesa, MD as Consulting Physician (General Surgery) Susan Rudd, MD as Consulting Physician (Radiation Oncology) Susan Daniel, Susan Dad, MD as Consulting Physician (Oncology) Susan Cruel, MD OTHER MD:  CHIEF COMPLAINT: estrogen receptor positive breast cancer  CURRENT TREATMENT: tamoxifen   INTERVAL HISTORY: Susan Daniel returns today for follow up of her estrogen receptor positive breast cancer.   Since her last visit, she received radiation therapy under Dr. Lisbeth Daniel from 05/19/2020 through 06/16/2020.  She was supposed to have see me in July but she was in [place] for a funeral she says at the time and could not get things rescheduled.  After coming back to town she had a horrible infection with a tooth and in short 1 thing led to another and she is finally back here.  Recall she has a Mirena IUD in place.  This 1 has been in place about a year she says.  Her daughter now 58 just started menstruating and Susan Daniel has seen some spotting herself.  She is going to bring this to Dr. Clementeen Daniel attention.  She is now ready to start tamoxifen.   REVIEW OF SYSTEMS: Susan Daniel is not exercising regularly.  She is busy with her daughters activities she says.  She has had no unusual headaches visual changes cough Daniel production pleurisy shortness of breath or change in bowel or bladder habits.  Detailed review of systems today was otherwise stable   COVID 19 VACCINATION STATUS: Never vaccinated; may have had Covid in January 2020 but not tested   HISTORY OF CURRENT ILLNESS: From the original intake  note:  Susan Daniel had routine screening mammography on showing a possible abnormality in the left breast. She underwent left diagnostic mammography with tomography and left breast ultrasonography at [facility] on 02/09/2020 showing: breast density category C; 0.8 cm spiculated mass in left breast at 6 o'clock; 1.4 cm hypoechoic mass in left breast at 5:30, unchanged compared to prior ultrasound in 08/2017; ultrasound of left axilla is negative.  Accordingly on 02/10/2020 she proceeded to biopsy of the left breast area in question. 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 Prognostic indicators significant for: estrogen receptor, 80% positive with moderate-strong staining intensity and progesterone receptor, 100% positive with strong staining intensity. Proliferation marker Ki67 at 10%. HER2 negative by immunohistochemistry (1+).  Cancer Staging Malignant neoplasm of lower-outer quadrant of left breast of female, estrogen receptor positive (Susan Daniel) Staging form: Breast, AJCC 8th Edition - Clinical stage from 02/17/2020: Stage IA (cT1b, cN0, cM0, G2, ER+, PR+, HER2+) - Signed by Susan Cruel, MD on 02/26/2020 Stage prefix: Initial diagnosis Method of lymph node assessment: Clinical - Pathologic stage from 03/23/2020: Stage IA (pT1c, pN0, cM0, G1, ER+, PR+, HER2-) - Signed by Susan Phlegm, NP on 03/31/2020 Histologic grading system: 3 grade system   The patient's subsequent history is as detailed below.   PAST MEDICAL HISTORY: Past Medical History:  Diagnosis Date   Allergy    Arthritis    Breast cancer ([place]) 28/31/5176   Left   Complication of anesthesia  constipation   Family history of breast cancer    Family history of rectal cancer     PAST SURGICAL HISTORY: Past Surgical History:  Procedure Laterality Date   BREAST BIOPSY Left 02/10/2020   LEFT    BREAST LUMPECTOMY WITH RADIOACTIVE SEED AND SENTINEL LYMPH NODE BIOPSY Left 03/11/2020   Procedure: LEFT BREAST LUMPECTOMY WITH RADIOACTIVE SEED AND LEFT AXILLARY SENTINEL LYMPH NODE BIOPSY WITH BLUE DYE INJECTION;  Surgeon: Susan Mesa, MD;  Location: [facility];  Service: General;  Laterality: Left;   DILATION AND CURETTAGE OF UTERUS     RE-EXCISION OF BREAST LUMPECTOMY Left 03/23/2020   Procedure: RE-EXCISION OF INFERIOR MARGIN;  Surgeon: Susan Mesa, MD;  Location: [facility];  Service: General;  Laterality: Left;   WISDOM TOOTH EXTRACTION      FAMILY HISTORY: Family History  Problem Relation Age of Onset   Heart disease Paternal Grandfather    Aneurysm Paternal Grandfather    Diabetes Maternal Grandmother    Heart disease Maternal Grandmother    Breast cancer Mother 24       second diagnosis at age 33   Kidney disease Maternal Grandfather    Irritable bowel syndrome Other        Cousins   Kidney disease Maternal Uncle    Breast cancer Cousin        dx late 30s/40s; Maternal 1st cousin, daughter of pt's mother's brother   Rectal cancer Maternal Aunt        dx late 30s/40s   Her father died at age 78 shortly after the patient's mother died at age 62 from from what may have been complications of an autologous transplant at [facility] for her breast cancer.. She had a history of breast cancer twice, first at age 59 then again at age 96. Susan Daniel is an only child. For full family information, see genetic counseling note from 02/24/2020.   GYNECOLOGIC HISTORY:  No LMP recorded. (Menstrual status: IUD). Menarche: 1 or 50 years old Age at first live birth: 50 years old, after multiple miscarriages GX P 1 LMP Mirena in place HRT no  Hysterectomy? no BSO? 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  Her husband Susan Daniel is an Chief Financial Officer.  Their daughter Susan Daniel is 42.  The patient in addition has a blue heeler and a [place] bulldog at home.  She is a  Psychologist, forensic    ADVANCED DIRECTIVES: In the absence of any documents to the contrary the patient's husband is her healthcare power of attorney   HEALTH MAINTENANCE: Social History   Tobacco Use   Smoking status: Every Day    Packs/day: 0.50    Types: Cigarettes   Smokeless tobacco: Never  Vaping Use   Vaping Use: Some days  Substance Use Topics   Alcohol use: Yes    Comment: ocassionally   Drug use: No     Colonoscopy: n/a  PAP: Up-to-date  Bone density: Never   Allergies  Allergen Reactions   Codeine Nausea And Vomiting   Hydrocodone Nausea And Vomiting   Sulfa Antibiotics Hives    Current Outpatient Medications  Medication Sig Dispense Refill   ibuprofen (ADVIL,MOTRIN) 200 MG tablet Take 400 mg by mouth every 6 (six) hours as needed for moderate pain.     levonorgestrel (MIRENA) 20 MCG/24HR IUD 1 each by Intrauterine route once.     Multiple Vitamin (MULTIVITAMIN WITH MINERALS) TABS tablet Take 1 tablet by mouth 3 (three) times a week.  traMADol (ULTRAM) 50 MG tablet Take 1 tablet (50 mg total) by mouth every 6 (six) hours as needed for moderate pain or severe pain. (Patient not taking: Reported on 04/28/2020) 12 tablet 0   No current facility-administered medications for this visit.    OBJECTIVE: White woman in no acute distress  Vitals:   11/08/20 1055  BP: 122/64  Pulse: 82  Resp: 18  Temp: 98.1 F (36.7 C)  SpO2: 99%      Body mass index is 25.56 kg/m.   Wt Readings from Last 3 Encounters:  11/08/20 156 lb (70.8 kg)  10/11/20 151 lb (68.5 kg)  05/18/20 147 lb 6.4 oz (66.9 kg)      ECOG FS:1 - Symptomatic but completely ambulatory  Sclerae unicteric, EOMs intact Wearing a mask No cervical or supraclavicular adenopathy Lungs no rales or rhonchi Heart regular rate and rhythm Abd soft, nontender, positive bowel sounds MSK no focal spinal tenderness, no upper extremity lymphedema Neuro: nonfocal, well oriented, appropriate affect Breasts: The right  breast is benign.  The left breast is status postlumpectomy and radiation.  There is no evidence of local recurrence.  Both axillae are benign   LAB RESULTS:  CMP     Component Value Date/Time   NA 141 02/26/2020 1547   K 4.3 02/26/2020 1547   CL 108 02/26/2020 1547   CO2 26 02/26/2020 1547   GLUCOSE 88 02/26/2020 1547   BUN 15 02/26/2020 1547   CREATININE 0.92 02/26/2020 1547   CREATININE 0.73 10/25/2012 1220   CALCIUM 9.0 02/26/2020 1547   PROT 6.4 (L) 02/26/2020 1547   ALBUMIN 3.8 02/26/2020 1547   AST 14 (L) 02/26/2020 1547   ALT 15 02/26/2020 1547   ALKPHOS 66 02/26/2020 1547   BILITOT 0.2 (L) 02/26/2020 1547   GFRNONAA >60 02/26/2020 1547   GFRAA  12/05/2007 0529    >60        The eGFR has been calculated using the MDRD equation. This calculation has not been validated in all clinical    No results found for: TOTALPROTELP, ALBUMINELP, A1GS, A2GS, BETS, BETA2SER, GAMS, MSPIKE, SPEI  Lab Results  Component Value Date   WBC 5.3 11/08/2020   NEUTROABS 3.2 11/08/2020   HGB 14.2 11/08/2020   HCT 42.5 11/08/2020   MCV 93.0 11/08/2020   PLT 247 11/08/2020    No results found for: LABCA2  No components found for: NWGNFA213  No results for input(s): INR in the last 168 hours.  No results found for: LABCA2  No results found for: YQM578  No results found for: ION629  No results found for: BMW413  No results found for: CA2729  No components found for: HGQUANT  No results found for: CEA1 / No results found for: CEA1   No results found for: AFPTUMOR  No results found for: CHROMOGRNA  No results found for: KPAFRELGTCHN, LAMBDASER, KAPLAMBRATIO (kappa/lambda light chains)  No results found for: HGBA, HGBA2QUANT, HGBFQUANT, HGBSQUAN (Hemoglobinopathy evaluation)   Lab Results  Component Value Date   LDH 120 12/05/2007    No results found for: IRON, TIBC, IRONPCTSAT (Iron and TIBC)  No results found for: FERRITIN  Urinalysis No results found  for: COLORURINE, APPEARANCEUR, LABSPEC, PHURINE, GLUCOSEU, HGBUR, BILIRUBINUR, KETONESUR, PROTEINUR, UROBILINOGEN, NITRITE, LEUKOCYTESUR   STUDIES: No results found.   ELIGIBLE FOR AVAILABLE RESEARCH PROTOCOL: No  ASSESSMENT: 50 y.o. [place] woman status post left breast lower outer quadrant biopsy 02/10/2020 for a clinical T1b N0, stage IA invasive ductal carcinoma, grade  2, estrogen and progesterone receptor positive, HER-2 not amplified, with an MIB-1 of 10%.  (1) genetics testing 03/01/2018 to 03/10/2020 through the [facility] panel and [facility] panel found no deleterious mutations in ATM, BRCA1, BRCA2, CDH1, CHEK2, PALB2, PTEN, and TP53;  APC, ATM, AXIN2, BARD1, BMPR1A, BRCA1, BRCA2, BRIP1, CDH1, CDK4, CDKN2A, CHEK2, DICER1, EPCAM, GREM1, HOXB13, MEN1, MLH1, MSH2, MSH3, MSH6, MUTYH, NBN, NF1, NF2, NTHL1, PALB2, PMS2, POLD1, POLE, PTEN, RAD51C, RAD51D, RECQL, RET, SDHA, SDHAF2, SDHB, SDHC, SDHD, SMAD4, SMARCA4, STK11, TP53, TSC1, TSC2, and VHL.    (2) left lumpectomy 03/11/2020 showed a pT1c pN0, stage IA  invasive ductal carcinoma, grade 2, with positive margins.  (a) a total of 3 left axillary lymph nodes were removed  (b) additional surgery 03/24/2018 cleared the margins  (3) Oncotype score of 20 predicts a risk of recurrence outside the breast within the next 9 years of 6% if the patient's only systemic therapy is antiestrogens for 5 years.  It also predicts no benefit from chemotherapy.  (4) adjuvant radiation 05/19/2020 through 06/16/2020 Site Technique Total Dose (Gy) Dose per Fx (Gy) Completed Fx Beam Energies  Breast, Left: Breast_Lt 3D 42.56/42.56 2.66 16/16 6X  Breast, Left: Breast_Lt_Bst 3D 8/8 2 4/4 6X   (5) tamoxifen started 11/08/2020  (a) Mirena IUD in place   PLAN: Jaydeen completed local treatment for breast cancer without any unusual side effects.  She is now ready to start systemic therapy.  We discussed the difference between tamoxifen  and anastrozole in detail. She understands that anastrozole and the aromatase inhibitors in general work by blocking estrogen production. Accordingly vaginal dryness, decrease in bone density, and of course hot flashes can result. The aromatase inhibitors can also negatively affect the cholesterol profile, although that is a minor effect. One out of 5 women on aromatase inhibitors we will feel "old and achy". This arthralgia/myalgia syndrome, which resembles fibromyalgia clinically, does resolve with stopping the medications. Accordingly this is not a reason to not try an aromatase inhibitor but it is a frequent reason to stop it (in other words 20% of women will not be able to tolerate these medications).  Tamoxifen on the other hand does not block estrogen production. It does not "take away a woman's estrogen". It blocks the estrogen receptor in breast cells. Like anastrozole, it can also cause hot flashes. As opposed to anastrozole, tamoxifen has many estrogen-like effects. It is technically an estrogen receptor modulator. This means that in some tissues tamoxifen works like estrogen-- for example it helps strengthen the bones. It tends to improve the cholesterol profile. It can cause thickening of the endometrial lining, and even endometrial polyps or rarely cancer of the uterus.(The risk of uterine cancer due to tamoxifen is one additional cancer per thousand women year). It can cause vaginal wetness or stickiness. It can cause blood clots through this estrogen-like effect--the risk of blood clots with tamoxifen is exactly the same as with birth control pills or hormone replacement.  Neither of these agents causes mood changes or weight gain, despite the popular belief that they can have these side effects. We have data from studies comparing either of these drugs with placebo, and in those cases the control group had the same amount of weight gain and depression as the group that took the drug.  We are  going to go with tamoxifen particularly given that she has an Mirena IUD in place.  I am going to check with Susan Daniel virtually in about 6 weeks  to make sure she is tolerating tamoxifen well.  She may have her next mammogram at any [facility] now but she would like to wait until January.  That order has been entered  Total encounter time 25 minutes.Sarajane Jews C. Tianne Plott, MD 11/08/2020 10:59 AM Medical Oncology and Hematology [facility] [place], [place] [address] Tel. [phone]    Fax. [phone]   This document serves as a record of services personally performed by Lurline Del, MD. It was created on his behalf by Wilburn Mylar, a trained medical scribe. The creation of this record is based on the scribe's personal observations and the provider's statements to them.   I, Lurline Del MD, have reviewed the above documentation for accuracy and completeness, and I agree with the above.   *Total Encounter Time as defined by the Centers for Medicare and Medicaid Services includes, in addition to the face-to-face time of a patient visit (documented in the note above) non-face-to-face time: obtaining and reviewing outside history, ordering and reviewing medications, tests or procedures, care coordination (communications with other health care professionals or caregivers) and documentation in the medical record.

## 2020-11-08 ENCOUNTER — Other Ambulatory Visit: Payer: Self-pay

## 2020-11-08 ENCOUNTER — Inpatient Hospital Stay: Payer: No Typology Code available for payment source | Attending: Oncology

## 2020-11-08 ENCOUNTER — Inpatient Hospital Stay (HOSPITAL_BASED_OUTPATIENT_CLINIC_OR_DEPARTMENT_OTHER): Payer: No Typology Code available for payment source | Admitting: Oncology

## 2020-11-08 VITALS — BP 122/64 | HR 82 | Temp 98.1°F | Resp 18 | Ht 65.5 in | Wt 156.0 lb

## 2020-11-08 DIAGNOSIS — Z170 Estrogen receptor positive status [ER+]: Secondary | ICD-10-CM | POA: Insufficient documentation

## 2020-11-08 DIAGNOSIS — C50512 Malignant neoplasm of lower-outer quadrant of left female breast: Secondary | ICD-10-CM | POA: Insufficient documentation

## 2020-11-08 DIAGNOSIS — F17210 Nicotine dependence, cigarettes, uncomplicated: Secondary | ICD-10-CM | POA: Diagnosis not present

## 2020-11-08 DIAGNOSIS — Z923 Personal history of irradiation: Secondary | ICD-10-CM | POA: Diagnosis not present

## 2020-11-08 LAB — CMP (CANCER CENTER ONLY)
ALT: 17 U/L (ref 0–44)
AST: 20 U/L (ref 15–41)
Albumin: 3.9 g/dL (ref 3.5–5.0)
Alkaline Phosphatase: 56 U/L (ref 38–126)
Anion gap: 8 (ref 5–15)
BUN: 18 mg/dL (ref 6–20)
CO2: 26 mmol/L (ref 22–32)
Calcium: 8.9 mg/dL (ref 8.9–10.3)
Chloride: 105 mmol/L (ref 98–111)
Creatinine: 0.71 mg/dL (ref 0.44–1.00)
GFR, Estimated: >60 mL/min (ref >60)
Glucose, Bld: 98 mg/dL (ref 70–99)
Potassium: 3.9 mmol/L (ref 3.5–5.1)
Sodium: 139 mmol/L (ref 135–145)
Total Bilirubin: 0.6 mg/dL (ref 0.3–1.2)
Total Protein: 6.5 g/dL (ref 6.5–8.1)

## 2020-11-08 LAB — CBC WITH DIFFERENTIAL/PLATELET
Abs Immature Granulocytes: 0.02 10*3/uL (ref 0.00–0.07)
Basophils Absolute: 0.1 10*3/uL (ref 0.0–0.1)
Basophils Relative: 1 %
Eosinophils Absolute: 0.1 10*3/uL (ref 0.0–0.5)
Eosinophils Relative: 2 %
HCT: 42.5 % (ref 36.0–46.0)
Hemoglobin: 14.2 g/dL (ref 12.0–15.0)
Immature Granulocytes: 0 %
Lymphocytes Relative: 26 %
Lymphs Abs: 1.4 10*3/uL (ref 0.7–4.0)
MCH: 31.1 pg (ref 26.0–34.0)
MCHC: 33.4 g/dL (ref 30.0–36.0)
MCV: 93 fL (ref 80.0–100.0)
Monocytes Absolute: 0.5 10*3/uL (ref 0.1–1.0)
Monocytes Relative: 10 %
Neutro Abs: 3.2 10*3/uL (ref 1.7–7.7)
Neutrophils Relative %: 61 %
Platelets: 247 10*3/uL (ref 150–400)
RBC: 4.57 MIL/uL (ref 3.87–5.11)
RDW: 12 % (ref 11.5–15.5)
WBC: 5.3 10*3/uL (ref 4.0–10.5)
nRBC: 0 % (ref 0.0–0.2)

## 2020-11-08 MED ORDER — TAMOXIFEN CITRATE 20 MG PO TABS: 20.0000 mg | ORAL_TABLET | Freq: Every day | ORAL | 4 refills | Status: AC

## 2020-11-11 ENCOUNTER — Ambulatory Visit: Payer: No Typology Code available for payment source | Attending: Oncology

## 2020-11-11 ENCOUNTER — Other Ambulatory Visit: Payer: Self-pay

## 2020-11-11 DIAGNOSIS — R600 Localized edema: Secondary | ICD-10-CM | POA: Insufficient documentation

## 2020-11-11 DIAGNOSIS — C50912 Malignant neoplasm of unspecified site of left female breast: Secondary | ICD-10-CM | POA: Diagnosis present

## 2020-11-11 DIAGNOSIS — Z483 Aftercare following surgery for neoplasm: Secondary | ICD-10-CM | POA: Diagnosis present

## 2020-11-11 DIAGNOSIS — R293 Abnormal posture: Secondary | ICD-10-CM | POA: Insufficient documentation

## 2020-11-11 DIAGNOSIS — C50512 Malignant neoplasm of lower-outer quadrant of left female breast: Secondary | ICD-10-CM | POA: Diagnosis present

## 2020-11-11 DIAGNOSIS — Z170 Estrogen receptor positive status [ER+]: Secondary | ICD-10-CM | POA: Insufficient documentation

## 2020-11-11 DIAGNOSIS — M25512 Pain in left shoulder: Secondary | ICD-10-CM | POA: Diagnosis present

## 2020-11-11 NOTE — Patient Instructions (Signed)
SHOULDER: Flexion - Supine (Cane)        Cancer Rehab [phone]    Hold cane in both hands. Raise arms up overhead. Do not allow back to arch. Hold _5__ seconds. Do __5-10__ times; __1-2__ times a day.  Hands shoulder width Hands in "V" position    Shoulder Blade Stretch    Clasp fingers behind head with elbows touching in front of face. Pull elbows back while pressing shoulder blades together. Relax and hold as tolerated, can place pillow under elbow here for comfort as needed and to allow for prolonged stretch.  Repeat __5__ times. Do __1-2__ sessions per day.     Trunk rotation stretch; left arm signal position Keep back flat, Feet on the bed, rotate legs to the right.  Hold 20 sec 4-5 Xs

## 2020-11-11 NOTE — Therapy (Signed)
[place] @ [address] [place], [place], [address] Phone: [phone]   Fax:  [phone]  Physical Therapy Treatment  Patient Details  Name: Susan Daniel MRN: [id number] Date of Birth: [birth year] Referring Provider (PT): Dr. Jana Hakim   Encounter Date: 11/11/2020   PT End of Session - 11/11/20 1510     Visit Number 14    Number of Visits 26    Date for PT Re-Evaluation 12/23/20    PT Start Time 1408    PT Stop Time 1502    PT Time Calculation (min) 54 min    Activity Tolerance Patient tolerated treatment well    Behavior During Therapy [facility] for tasks assessed/performed             Past Medical History:  Diagnosis Date   Allergy    Arthritis    Breast cancer ([place]) 16/38/4665   Left   Complication of anesthesia    constipation   Family history of breast cancer    Family history of rectal cancer     Past Surgical History:  Procedure Laterality Date   BREAST BIOPSY Left 02/10/2020   LEFT   BREAST LUMPECTOMY WITH RADIOACTIVE SEED AND SENTINEL LYMPH NODE BIOPSY Left 03/11/2020   Procedure: LEFT BREAST LUMPECTOMY WITH RADIOACTIVE SEED AND LEFT AXILLARY SENTINEL LYMPH NODE BIOPSY WITH BLUE DYE INJECTION;  Surgeon: Donnie Mesa, MD;  Location: [place];  Service: General;  Laterality: Left;   DILATION AND CURETTAGE OF UTERUS     RE-EXCISION OF BREAST LUMPECTOMY Left 03/23/2020   Procedure: RE-EXCISION OF INFERIOR MARGIN;  Surgeon: Donnie Mesa, MD;  Location: [place];  Service: General;  Laterality: Left;   WISDOM TOOTH EXTRACTION      There were no vitals filed for this visit.   Subjective Assessment - 11/11/20 1412     Subjective I didn't get to come back for my final visits because I was sick and out of town.  I'm not sure if I am swollen or not, but I have been wearing a compression bra which I hate.  I need to review the exercises and MLD. Left shoulder feels a little tight, but the  axillary part is where I feel pulling and I feel sensitive around it. My left breast feels heavy.   Pertinent History Pt is s/p left Lumpectomy with SLNB on 03/11/2020 for Invasive Ductal Carcinoma.  She had to have a re-excision performed on 03/23/2020.  ON 03/23/2020 she had a seroma drained that had occured after the first surgery.  She had radiation which ended in June 1st    Patient Stated Goals Want to review exercises and learn MLD again, not be protective about my left arm.    Currently in Pain? No/denies    Multiple Pain Sites No                [facility] PT Assessment - 11/11/20 0001       Assessment   Medical Diagnosis Left Invasive Ductal Carcinoma    Referring Provider (PT) Dr. Jana Hakim    Onset Date/Surgical Date 03/11/20    Hand Dominance Right      Precautions   Precaution Comments lymphedema risk      Restrictions   Weight Bearing Restrictions No      Balance Screen   Has the patient fallen in the past 6 months No    Has the patient had a decrease in activity level because of a fear  of falling?  No    Is the patient reluctant to leave their home because of a fear of falling?  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 mild fibrosis noted at axillary incision, mild swelling at prox and lateral breast      AROM   Left Shoulder Extension 59 Degrees    Left Shoulder Flexion 158 Degrees    Left Shoulder ABduction 175 Degrees   pulls axilla and upper  breast   Left Shoulder External Rotation 90 Degrees               LYMPHEDEMA/ONCOLOGY QUESTIONNAIRE - 11/11/20 0001       Surgeries   Lumpectomy Date 03/11/20    Other Surgery Date 03/23/20      Treatment   Active Chemotherapy Treatment No    Past Chemotherapy Treatment No    Past Radiation Treatment Yes    Current Hormone Treatment Yes   hasn't started yet   Past Hormone Therapy No      What other symptoms do you have   Are you Having Heaviness or Tightness Yes   breast   Are you having Pain Yes   achiness if stretching at lateral trunk, breast is sore/swollen   Is it Hard or Difficult finding clothes that fit No    Do you have infections No      Right Upper Extremity Lymphedema   10 cm Proximal to Olecranon Process 29.1 cm    Olecranon Process 26.5 cm    10 cm Proximal to Ulnar Styloid Process 22.3 cm    Just Proximal to Ulnar Styloid Process 16.6 cm    At Base of 2nd Digit 6.4 cm      Left Upper Extremity Lymphedema   10 cm Proximal to Olecranon Process 29 cm    Olecranon Process 25.3 cm    10 cm Proximal to Ulnar Styloid Process 21.9 cm    Just Proximal to Ulnar Styloid Process 16.45 cm    At Base of 2nd Digit 6.4 cm                Quick Dash - 11/11/20 0001     Open a tight or new jar No difficulty    Do heavy household chores (wash walls, wash floors) Mild difficulty    Carry a shopping bag or briefcase No difficulty    Wash your back Mild difficulty    Use a knife to cut food No difficulty    Recreational activities in which you take some force or impact through your arm, shoulder, or hand (golf, hammering, tennis) Mild difficulty    During the past week, to what extent has your arm, shoulder or hand problem interfered with your normal social activities with family, friends, neighbors, or groups? Slightly    During the past week, to what extent has your arm, shoulder or hand problem limited your work or other  regular daily activities Slightly     Arm, shoulder, or hand pain. Mild    Tingling (pins and needles) in your arm, shoulder, or hand None    Difficulty Sleeping No difficulty    DASH Score 13.64 %                    [facility] Adult PT Treatment/Exercise - 11/11/20 0001       Shoulder Exercises: Supine   Other Supine Exercises Supine wand flexion and scaption, LTR to the right x 3-4 reps each                     PT Education - 11/11/20 1457     Education Details Supine wand flexion and scaption, LTR with left arm in abd/ER    Person(s) Educated Patient    Methods Handout;Demonstration    Comprehension Returned demonstration                 PT Long Term Goals - 11/11/20 1524       PT LONG TERM GOAL #1   Title Pt will be independent and compliant with HEP for shoulder ROM/strength    Period Weeks    Status On-going    Target Date 12/23/20      PT LONG TERM GOAL #2   Title Pt will have left shoulder AROM WNL and without chest/lat tightness for improved reaching    Time 6    Period Weeks    Status On-going    Target Date 12/23/20      PT LONG TERM GOAL #3   Title Pt will have decreased pain by atleast 50%    Baseline 75% better    Time 6    Period Weeks    Status Achieved      PT LONG TERM GOAL #4   Title Pt will be independent with self MLD prn    Time 6    Period Weeks    Status On-going    Target Date 12/23/20      PT LONG TERM GOAL #5   Title Quick dash will be no greater than 15% to demonstrate improved function    Baseline 13% today    Period Weeks    Status Achieved      Additional Long Term Goals   Additional Long Term Goals Yes      PT LONG TERM GOAL #6   Title Pt will have decreased left breast heaviness/discomfort by 50% or greater    Time 6    Period Weeks    Status New    Target Date 12/23/20                   Plan - 11/11/20 1511     Clinical Impression Statement Pt did not complete her visits and was sick and away on vacation.  She  completed radiation in June.  She has started feeling some left  breast heaviness/swelling and discomfort at proximal and lateral breast.  She feels she has gotten a little tighter with her reaching activities.  Cording is overall better. She has no significant differences in arm circumference and left thumb/wrist swelling is greatly improved from when she was last seen. She will benefit from review of stretches, strengthening and manual lymph drainage to address deficits and return to PLOF    Stability/Clinical Decision Making Stable/Uncomplicated    Rehab Potential Excellent    PT Frequency 2x / week  PT Duration 6 weeks   decreasing to 1x/week as she improves   PT Treatment/Interventions ADLs/Self Care Home Management;Therapeutic exercise;Manual techniques;Patient/family education;Manual lymph drainage;Passive range of motion;Vasopneumatic Device;Taping    PT Next Visit Plan review pec stretches, left breast MLD,  STM/MFR to chest/axillary region, PROM prn    Consulted and Agree with Plan of Care Patient             Patient will benefit from skilled therapeutic intervention in order to improve the following deficits and impairments:  Decreased activity tolerance, Decreased knowledge of precautions, Decreased range of motion, Decreased scar mobility, Increased edema, Increased fascial restricitons, Postural dysfunction, Impaired UE functional use, Pain  Visit Diagnosis: Abnormal posture  Localized edema  Aftercare following surgery for neoplasm  Invasive ductal carcinoma of breast, female, left [facility])     Problem List Patient Active Problem List   Diagnosis Date Noted   Genetic testing 03/04/2020   Tobacco abuse 02/26/2020   Family history of breast cancer    Family history of rectal cancer    Malignant neoplasm of lower-outer quadrant of left breast of female, estrogen receptor positive ([place]) 02/13/2020    Claris Pong, PT 11/11/2020, 3:27 PM  [place] @ [address] [place], [place], [address] Phone: [phone]   Fax:  [phone]  Name: Susan Daniel MRN: [id number] Date of Birth: [birth year]

## 2020-11-15 ENCOUNTER — Ambulatory Visit: Payer: No Typology Code available for payment source

## 2020-11-15 ENCOUNTER — Other Ambulatory Visit: Payer: Self-pay

## 2020-11-15 DIAGNOSIS — R600 Localized edema: Secondary | ICD-10-CM

## 2020-11-15 DIAGNOSIS — Z483 Aftercare following surgery for neoplasm: Secondary | ICD-10-CM

## 2020-11-15 DIAGNOSIS — M25512 Pain in left shoulder: Secondary | ICD-10-CM

## 2020-11-15 DIAGNOSIS — R293 Abnormal posture: Secondary | ICD-10-CM

## 2020-11-15 DIAGNOSIS — C50912 Malignant neoplasm of unspecified site of left female breast: Secondary | ICD-10-CM

## 2020-11-15 NOTE — Therapy (Signed)
[place] @ [address] [place], [place], [address] Phone: [phone]   Fax:  [phone]  Physical Therapy Treatment  Patient Details  Name: Susan Daniel MRN: [id number] Date of Birth: [birth year] Referring Provider (PT): Dr. Jana Hakim   Encounter Date: 11/15/2020   PT End of Session - 11/15/20 1116     Visit Number 15    Number of Visits 26    Date for PT Re-Evaluation 12/23/20    PT Start Time 1103    PT Stop Time 6834    PT Time Calculation (min) 53 min    Activity Tolerance Patient tolerated treatment well    Behavior During Therapy [facility] for tasks assessed/performed             Past Medical History:  Diagnosis Date   Allergy    Arthritis    Breast cancer ([place]) 19/62/2297   Left   Complication of anesthesia    constipation   Family history of breast cancer    Family history of rectal cancer     Past Surgical History:  Procedure Laterality Date   BREAST BIOPSY Left 02/10/2020   LEFT   BREAST LUMPECTOMY WITH RADIOACTIVE SEED AND SENTINEL LYMPH NODE BIOPSY Left 03/11/2020   Procedure: LEFT BREAST LUMPECTOMY WITH RADIOACTIVE SEED AND LEFT AXILLARY SENTINEL LYMPH NODE BIOPSY WITH BLUE DYE INJECTION;  Surgeon: Donnie Mesa, MD;  Location: [place];  Service: General;  Laterality: Left;   DILATION AND CURETTAGE OF UTERUS     RE-EXCISION OF BREAST LUMPECTOMY Left 03/23/2020   Procedure: RE-EXCISION OF INFERIOR MARGIN;  Surgeon: Donnie Mesa, MD;  Location: [place];  Service: General;  Laterality: Left;   WISDOM TOOTH EXTRACTION      There were no vitals filed for this visit.   Subjective Assessment - 11/15/20 1109     Subjective No pain, but I can still feel the pull in my axillary region and under the breast.  I see Dr. Georgette Dover on Friday    Pertinent History Pt is s/p left Lumpectomy with SLNB on 03/11/2020 for Invasive Ductal Carcinoma.  She had to have a re-excision performed on  03/23/2020.  ON 03/23/2020 she had a seroma drained that had occured after the first surgery.  She had radiation which ended in June 1st    Patient Stated Goals Want to review exercises and learn MLD again, not be protective about my left arm.    Currently in Pain? No/denies                               [facility] Adult PT Treatment/Exercise - 11/15/20 0001       Shoulder Exercises: Pulleys   Flexion 2 minutes    ABduction 2 minutes      Manual Therapy   Manual therapy comments no cording felt today    Soft tissue mobilization left pectorals and lats in supine with cocobutter to decarease tenderness    Myofascial Release Left axillary region    Manual Lymphatic Drainage (MLD) supraclavicular, 5 diaphragmatic breaths, bilateral axillary LN's, left Inguinal LN's, anterior interaxillary pathway, left axillo-inguinal pathway and left breast retracing pathways and ending with LN's.  PT performed some and instructed and pt peformed the rest.performed    Passive ROM PROM left shoulder flexion scaption, abduction  PT Long Term Goals - 11/11/20 1524       PT LONG TERM GOAL #1   Title Pt will be independent and compliant with HEP for shoulder ROM/strength    Period Weeks    Status On-going    Target Date 12/23/20      PT LONG TERM GOAL #2   Title Pt will have left shoulder AROM WNL and without chest/lat tightness for improved reaching    Time 6    Period Weeks    Status On-going    Target Date 12/23/20      PT LONG TERM GOAL #3   Title Pt will have decreased pain by atleast 50%    Baseline 75% better    Time 6    Period Weeks    Status Achieved      PT LONG TERM GOAL #4   Title Pt will be independent with self MLD prn    Time 6    Period Weeks    Status On-going    Target Date 12/23/20      PT LONG TERM GOAL #5   Title Quick dash will be no greater than 15% to demonstrate improved function    Baseline 13% today    Period  Weeks    Status Achieved      Additional Long Term Goals   Additional Long Term Goals Yes      PT LONG TERM GOAL #6   Title Pt will have decreased left breast heaviness/discomfort by 50% or greater    Time 6    Period Weeks    Status New    Target Date 12/23/20                   Plan - 11/15/20 1203     Clinical Impression Statement Therapy consisted of AAROM, MFR to axilla, Soft tissue mobilization to pecs and lats and education in self breast MLD. There were no palpable axillary cords today.  Pt did very well with SElf MLD but did require intermittent verbal and tactile cues. Pt already has a handout at home.   Stability/Clinical Decision Making Stable/Uncomplicated    Rehab Potential Excellent    PT Frequency 2x / week    PT Duration 6 weeks    PT Treatment/Interventions ADLs/Self Care Home Management;Therapeutic exercise;Manual techniques;Patient/family education;Manual lymph drainage;Passive range of motion;Vasopneumatic Device;Taping    PT Next Visit Plan review pec stretches, left breast MLD,  STM/MFR to chest/axillary region, PROM prn,    PT Home Exercise Plan stretches, left breast MLD    Consulted and Agree with Plan of Care Patient             Patient will benefit from skilled therapeutic intervention in order to improve the following deficits and impairments:  Decreased activity tolerance, Decreased knowledge of precautions, Decreased range of motion, Decreased scar mobility, Increased edema, Increased fascial restricitons, Postural dysfunction, Impaired UE functional use, Pain  Visit Diagnosis: Localized edema  Abnormal posture  Aftercare following surgery for neoplasm  Invasive ductal carcinoma of breast, female, left ([place])  Acute pain of left shoulder     Problem List Patient Active Problem List   Diagnosis Date Noted   Genetic testing 03/04/2020   Tobacco abuse 02/26/2020   Family history of breast cancer    Family history of rectal  cancer    Malignant neoplasm of lower-outer quadrant of left breast of female, estrogen receptor positive ([place]) 02/13/2020    Claris Pong, PT 11/15/2020,  12:07 PM  [place] @ [address] [place], [place], [address] Phone: [phone]   Fax:  [phone]  Name: Susan Daniel MRN: [id number] Date of Birth: 1970-02-25

## 2020-11-29 ENCOUNTER — Ambulatory Visit: Payer: No Typology Code available for payment source | Attending: Oncology

## 2020-11-29 ENCOUNTER — Other Ambulatory Visit: Payer: Self-pay

## 2020-11-29 DIAGNOSIS — R293 Abnormal posture: Secondary | ICD-10-CM | POA: Diagnosis present

## 2020-11-29 DIAGNOSIS — Z483 Aftercare following surgery for neoplasm: Secondary | ICD-10-CM | POA: Insufficient documentation

## 2020-11-29 DIAGNOSIS — C50912 Malignant neoplasm of unspecified site of left female breast: Secondary | ICD-10-CM | POA: Diagnosis present

## 2020-11-29 DIAGNOSIS — R600 Localized edema: Secondary | ICD-10-CM | POA: Diagnosis present

## 2020-11-29 DIAGNOSIS — M25512 Pain in left shoulder: Secondary | ICD-10-CM | POA: Diagnosis present

## 2020-11-29 NOTE — Therapy (Signed)
[place] @ [address] [place], [place], [address] Phone: [phone]   Fax:  [phone]  Physical Therapy Treatment  Patient Details  Name: Susan Daniel MRN: [id number] Date of Birth: [birth year] Referring Provider (PT): Dr. Jana Hakim   Encounter Date: 11/29/2020   PT End of Session - 11/29/20 1459     Visit Number 16    Number of Visits 26    Date for PT Re-Evaluation 12/23/20    PT Start Time 1406    PT Stop Time 8185    PT Time Calculation (min) 52 min    Activity Tolerance Patient tolerated treatment well    Behavior During Therapy [facility] for tasks assessed/performed             Past Medical History:  Diagnosis Date   Allergy    Arthritis    Breast cancer ([place]) 63/14/9702   Left   Complication of anesthesia    constipation   Family history of breast cancer    Family history of rectal cancer     Past Surgical History:  Procedure Laterality Date   BREAST BIOPSY Left 02/10/2020   LEFT   BREAST LUMPECTOMY WITH RADIOACTIVE SEED AND SENTINEL LYMPH NODE BIOPSY Left 03/11/2020   Procedure: LEFT BREAST LUMPECTOMY WITH RADIOACTIVE SEED AND LEFT AXILLARY SENTINEL LYMPH NODE BIOPSY WITH BLUE DYE INJECTION;  Surgeon: Donnie Mesa, MD;  Location: [place];  Service: General;  Laterality: Left;   DILATION AND CURETTAGE OF UTERUS     RE-EXCISION OF BREAST LUMPECTOMY Left 03/23/2020   Procedure: RE-EXCISION OF INFERIOR MARGIN;  Surgeon: Donnie Mesa, MD;  Location: [place];  Service: General;  Laterality: Left;   WISDOM TOOTH EXTRACTION      There were no vitals filed for this visit.   Subjective Assessment - 11/29/20 1410     Subjective I have been doing my exercises. Used a leaf blower yesterday and my arm is sore, but It really hurts because I fell out of a rolling chair yesterday and caught myself on my left arm and hand. My forearm is sore from doing the leaves I think.    Pertinent History  Pt is s/p left Lumpectomy with SLNB on 03/11/2020 for Invasive Ductal Carcinoma.  She had to have a re-excision performed on 03/23/2020.  ON 03/23/2020 she had a seroma drained that had occured after the first surgery.  She had radiation which ended in June 1st    Patient Stated Goals Want to review exercises and learn MLD again, not be protective about my left arm.    Currently in Pain? No/denies    Multiple Pain Sites No                               [facility] Adult PT Treatment/Exercise - 11/29/20 0001       Manual Therapy   Manual therapy comments deeper less prominent cords  felt today    Soft tissue mobilization left pectorals and lats in supine with cocobutter to decrease tenderness    Myofascial Release Left axillary region    Manual Lymphatic Drainage (MLD) supraclavicular, 5 diaphragmatic breaths, bilateral axillary LN's, left Inguinal LN's, anterior interaxillary pathway, left axillo-inguinal pathway and left breast retracing pathways and ending with LN's.  PT performed some and instructed and pt peformed the rest.    Passive ROM PROM left shoulder flexion scaption, abduction  PT Long Term Goals - 11/11/20 1524       PT LONG TERM GOAL #1   Title Pt will be independent and compliant with HEP for shoulder ROM/strength    Period Weeks    Status On-going    Target Date 12/23/20      PT LONG TERM GOAL #2   Title Pt will have left shoulder AROM WNL and without chest/lat tightness for improved reaching    Time 6    Period Weeks    Status On-going    Target Date 12/23/20      PT LONG TERM GOAL #3   Title Pt will have decreased pain by atleast 50%    Baseline 75% better    Time 6    Period Weeks    Status Achieved      PT LONG TERM GOAL #4   Title Pt will be independent with self MLD prn    Time 6    Period Weeks    Status On-going    Target Date 12/23/20      PT LONG TERM GOAL #5   Title Quick dash will be no  greater than 15% to demonstrate improved function    Baseline 13% today    Period Weeks    Status Achieved      Additional Long Term Goals   Additional Long Term Goals Yes      PT LONG TERM GOAL #6   Title Pt will have decreased left breast heaviness/discomfort by 50% or greater    Time 6    Period Weeks    Status New    Target Date 12/23/20                   Plan - 11/29/20 1459     Clinical Impression Statement Therapy consisted of MFR techniques to arm and axillary cording, and MLD to pts. left breast with review also given to pt.  There continue to be cords although not as prominent and not appearing to limit shoulder ROM.  pt did exceptionally well with self MLD and seems to have a good understanding of sequence and technique.  Left breast was much softer after MLD.    Stability/Clinical Decision Making Stable/Uncomplicated    Rehab Potential Excellent    PT Frequency 2x / week    PT Duration 6 weeks    PT Treatment/Interventions ADLs/Self Care Home Management;Therapeutic exercise;Manual techniques;Patient/family education;Manual lymph drainage;Passive range of motion;Vasopneumatic Device;Taping    PT Next Visit Plan check goals,review pec stretches, left breast MLD,  STM/MFR to chest/axillary region, PROM prn,    PT Home Exercise Plan stretches, left breast MLD    Consulted and Agree with Plan of Care Patient             Patient will benefit from skilled therapeutic intervention in order to improve the following deficits and impairments:  Decreased activity tolerance, Decreased knowledge of precautions, Decreased range of motion, Decreased scar mobility, Increased edema, Increased fascial restricitons, Postural dysfunction, Impaired UE functional use, Pain  Visit Diagnosis: Localized edema  Abnormal posture  Aftercare following surgery for neoplasm  Invasive ductal carcinoma of breast, female, left ([place])  Acute pain of left shoulder     Problem  List Patient Active Problem List   Diagnosis Date Noted   Genetic testing 03/04/2020   Tobacco abuse 02/26/2020   Family history of breast cancer    Family history of rectal cancer    Malignant neoplasm of lower-outer quadrant  of left breast of female, estrogen receptor positive ([place]) 02/13/2020    Claris Pong, PT 11/29/2020, 3:11 PM  [place] @ [address] [place], [place], [address] Phone: [phone]   Fax:  [phone]  Name: Susan Daniel MRN: [id number] Date of Birth: 1970/07/07

## 2020-12-02 ENCOUNTER — Ambulatory Visit: Payer: No Typology Code available for payment source

## 2020-12-02 ENCOUNTER — Other Ambulatory Visit: Payer: Self-pay

## 2020-12-02 DIAGNOSIS — R600 Localized edema: Secondary | ICD-10-CM | POA: Diagnosis not present

## 2020-12-02 DIAGNOSIS — Z483 Aftercare following surgery for neoplasm: Secondary | ICD-10-CM

## 2020-12-02 DIAGNOSIS — R293 Abnormal posture: Secondary | ICD-10-CM

## 2020-12-02 DIAGNOSIS — M25512 Pain in left shoulder: Secondary | ICD-10-CM

## 2020-12-02 DIAGNOSIS — C50912 Malignant neoplasm of unspecified site of left female breast: Secondary | ICD-10-CM

## 2020-12-02 NOTE — Therapy (Signed)
[facility] @ [address] [place], [place], [address] Phone: [phone]   Fax:  [phone]  Physical Therapy Treatment  Patient Details  Name: Susan Daniel MRN: [id number] Date of Birth: 06-02-1970 Referring Provider (PT): Dr. Jana Hakim   Encounter Date: 12/02/2020   PT End of Session - 12/02/20 1320     Visit Number 17    Number of Visits 26    Date for PT Re-Evaluation 12/23/20    PT Start Time 1307    PT Stop Time 1354    PT Time Calculation (min) 47 min    Activity Tolerance Patient tolerated treatment well    Behavior During Therapy [facility] for tasks assessed/performed             Past Medical History:  Diagnosis Date   Allergy    Arthritis    Breast cancer ([place]) 95/63/8756   Left   Complication of anesthesia    constipation   Family history of breast cancer    Family history of rectal cancer     Past Surgical History:  Procedure Laterality Date   BREAST BIOPSY Left 02/10/2020   LEFT   BREAST LUMPECTOMY WITH RADIOACTIVE SEED AND SENTINEL LYMPH NODE BIOPSY Left 03/11/2020   Procedure: LEFT BREAST LUMPECTOMY WITH RADIOACTIVE SEED AND LEFT AXILLARY SENTINEL LYMPH NODE BIOPSY WITH BLUE DYE INJECTION;  Surgeon: Donnie Mesa, MD;  Location: [place];  Service: General;  Laterality: Left;   DILATION AND CURETTAGE OF UTERUS     RE-EXCISION OF BREAST LUMPECTOMY Left 03/23/2020   Procedure: RE-EXCISION OF INFERIOR MARGIN;  Surgeon: Donnie Mesa, MD;  Location: [place];  Service: General;  Laterality: Left;   WISDOM TOOTH EXTRACTION      There were no vitals filed for this visit.   Subjective Assessment - 12/02/20 1307     Subjective My arm isn't as sore from using the leaf blower. Cording seems to be doing better.  I am tender at the incision still. Did MLD last night and I think I did OK.    Pertinent History Pt is s/p left Lumpectomy with SLNB on 03/11/2020 for Invasive Ductal Carcinoma.   She had to have a re-excision performed on 03/23/2020.  ON 03/23/2020 she had a seroma drained that had occured after the first surgery.  She had radiation which ended in June 1st                [facility] PT Assessment - 12/02/20 0001       AROM   Left Shoulder Flexion 164 Degrees    Left Shoulder ABduction 180 Degrees                           [facility] Adult PT Treatment/Exercise - 12/02/20 0001       Manual Therapy       Myofascial Release Left axillary region    Manual Lymphatic Drainage (MLD) Pt performed supraclavicular, 5 diaphragmatic breaths, bilateral axillary LN's, left Inguinal LN's, anterior interaxillary pathway, left axillo-inguinal pathway and left breast retracing pathways and ending with LN's.  PT provided intermittent VC'c /TC's    Passive ROM PROM left shoulder flexion scaption, abduction                          PT Long Term Goals - 12/02/20 1315       PT LONG TERM GOAL #1  Title Pt will be independent and compliant with HEP for shoulder ROM/strength    Time 4    Period Weeks    Status Achieved    Target Date 12/02/20      PT LONG TERM GOAL #2   Title Pt will have left shoulder AROM WNL and without chest/lat tightness for improved reaching    Baseline mild pulling    Time 6    Period Weeks    Status On-going    Target Date 12/23/20      PT LONG TERM GOAL #3   Title Pt will have decreased pain by atleast 50%    Baseline 75% better    Time 6    Period Weeks    Status Achieved      PT LONG TERM GOAL #4   Title Pt will be independent with self MLD prn    Time 6    Period Weeks    Status Achieved    Target Date 12/02/20      PT LONG TERM GOAL #5   Title Quick dash will be no greater than 15% to demonstrate improved function    Time 6    Period Weeks    Status Achieved      PT LONG TERM GOAL #6   Title Pt will have decreased left breast heaviness/discomfort by 50% or greater    Time 6    Period Weeks    Status  On-going                   Plan - 12/02/20 1320     Clinical Impression Statement Pt is making excellent progress toward goals.  Shoulder ROM is WNL and cording is minimal, but she still feels slight pulling in the breast region.  She continues with several areas of fibrosis in the left breast. She is improving with MLD and her breast is softer after performing MLD.  We will skip a week and return the following Monday    Stability/Clinical Decision Making Stable/Uncomplicated    Rehab Potential Excellent    PT Frequency 2x / week    PT Duration 6 weeks    PT Treatment/Interventions ADLs/Self Care Home Management;Therapeutic exercise;Manual techniques;Patient/family education;Manual lymph drainage;Passive range of motion;Vasopneumatic Device;Taping    PT Next Visit Plan review pec stretches, left breast MLD,  STM/MFR to chest/axillary region, PROM prn,    PT Home Exercise Plan stretches, left breast MLD    Consulted and Agree with Plan of Care Patient             Patient will benefit from skilled therapeutic intervention in order to improve the following deficits and impairments:  Decreased activity tolerance, Decreased knowledge of precautions, Decreased range of motion, Decreased scar mobility, Increased edema, Increased fascial restricitons, Postural dysfunction, Impaired UE functional use, Pain  Visit Diagnosis: Localized edema  Abnormal posture  Aftercare following surgery for neoplasm  Invasive ductal carcinoma of breast, female, left ([place])  Acute pain of left shoulder     Problem List Patient Active Problem List   Diagnosis Date Noted   Genetic testing 03/04/2020   Tobacco abuse 02/26/2020   Family history of breast cancer    Family history of rectal cancer    Malignant neoplasm of lower-outer quadrant of left breast of female, estrogen receptor positive ([place]) 02/13/2020    Claris Pong, PT 12/02/2020, 2:00 PM  [facility] @ [facility] [place], [place], [address]  Phone: [phone]   Fax:  [phone]  Name: Susan Daniel MRN: [id number] Date of Birth: May 08, 1970

## 2020-12-20 ENCOUNTER — Other Ambulatory Visit: Payer: Self-pay

## 2020-12-20 ENCOUNTER — Ambulatory Visit: Payer: No Typology Code available for payment source | Attending: Oncology

## 2020-12-20 DIAGNOSIS — M25512 Pain in left shoulder: Secondary | ICD-10-CM

## 2020-12-20 DIAGNOSIS — Z483 Aftercare following surgery for neoplasm: Secondary | ICD-10-CM | POA: Diagnosis present

## 2020-12-20 DIAGNOSIS — C50912 Malignant neoplasm of unspecified site of left female breast: Secondary | ICD-10-CM | POA: Diagnosis present

## 2020-12-20 DIAGNOSIS — R600 Localized edema: Secondary | ICD-10-CM

## 2020-12-20 DIAGNOSIS — R293 Abnormal posture: Secondary | ICD-10-CM

## 2020-12-20 NOTE — Therapy (Signed)
[facility] @ [facility] [address] [place], [place], [address] Phone: [phone]   Fax:  [phone]  Physical Therapy Treatment  Patient Details  Name: Susan Daniel MRN: [id number] Date of Birth: 05-30-1970 Referring Provider (PT): Dr. Jana Hakim   Encounter Date: 12/20/2020   PT End of Session - 12/20/20 1355     Visit Number 18    Number of Visits 26    Date for PT Re-Evaluation 12/23/20    PT Start Time 1307    PT Stop Time 1403    PT Time Calculation (min) 56 min    Activity Tolerance Patient tolerated treatment well    Behavior During Therapy [facility] for tasks assessed/performed             Past Medical History:  Diagnosis Date   Allergy    Arthritis    Breast cancer ([place]) 40/37/0964   Left   Complication of anesthesia    constipation   Family history of breast cancer    Family history of rectal cancer     Past Surgical History:  Procedure Laterality Date   BREAST BIOPSY Left 02/10/2020   LEFT   BREAST LUMPECTOMY WITH RADIOACTIVE SEED AND SENTINEL LYMPH NODE BIOPSY Left 03/11/2020   Procedure: LEFT BREAST LUMPECTOMY WITH RADIOACTIVE SEED AND LEFT AXILLARY SENTINEL LYMPH NODE BIOPSY WITH BLUE DYE INJECTION;  Surgeon: Donnie Mesa, MD;  Location: [facility];  Service: General;  Laterality: Left;   DILATION AND CURETTAGE OF UTERUS     RE-EXCISION OF BREAST LUMPECTOMY Left 03/23/2020   Procedure: RE-EXCISION OF INFERIOR MARGIN;  Surgeon: Donnie Mesa, MD;  Location: [facility];  Service: General;  Laterality: Left;   WISDOM TOOTH EXTRACTION      There were no vitals filed for this visit.   Subjective Assessment - 12/20/20 1308     Subjective I had a stomach virus last week for several days. I haven't done much since then Before that I did do some MLD. Left Breast doesn't seem as bad. Still have a few firm places that soften with MLD. My ribs are a little sore to touch. I feeel like I am doing the MLD  correctly for the most part.. Breast heaviness is better.   Pertinent History Pt is s/p left Lumpectomy with SLNB on 03/11/2020 for Invasive Ductal Carcinoma.  She had to have a re-excision performed on 03/23/2020.  ON 03/23/2020 she had a seroma drained that had occured after the first surgery.  She had radiation which ended in June 1st    Patient Stated Goals Want to review exercises and learn MLD again, not be protective about my left arm.    Currently in Pain? No/denies                [facility] PT Assessment - 12/20/20 0001       AROM   Left Shoulder Flexion 164 Degrees    Left Shoulder ABduction 180 Degrees    Left Shoulder External Rotation 95 Degrees                           [facility] Adult PT Treatment/Exercise - 12/20/20 0001       Manual Therapy   Manual therapy comments no cording present today,    Myofascial Release Left axillary region    Manual Lymphatic Drainage (MLD) Pt performed supraclavicular, 5 diaphragmatic breaths, bilateral axillary LN's, left Inguinal LN's, anterior interaxillary pathway, left  axillo-inguinal pathway and left breast retracing pathways and ending with LN's.  PT provided intermittent VC'c /TC's    Passive ROM PROM left shoulder flexion scaption, abduction                          PT Long Term Goals - 12/20/20 1356       PT LONG TERM GOAL #1   Title Pt will be independent and compliant with HEP for shoulder ROM/strength    Time 4    Period Weeks    Status Achieved      PT LONG TERM GOAL #2   Title Pt will have left shoulder AROM WNL and without chest/lat tightness for improved reaching    Period Weeks    Status Achieved      PT LONG TERM GOAL #3   Title Pt will have decreased pain by atleast 50%    Baseline 95% better    Time 6    Period Weeks    Status Achieved      PT LONG TERM GOAL #4   Title Pt will be independent with self MLD prn    Time 6    Status Achieved      PT LONG TERM GOAL #5   Title  Quick dash will be no greater than 15% to demonstrate improved function    Time 6    Period Weeks    Status Achieved      PT LONG TERM GOAL #6   Title Pt will have decreased left breast heaviness/discomfort by 50% or greater    Time 6    Period Weeks    Status  Achieved                   Plan - 12/20/20 1410     Clinical Impression Statement Pt has achieved all goals established.  Her shoulder ROM is WNL and there is very little pulling. We practiced left breast MLD today and she did exceptionally well with only occasional VC's required.  Left breast swelling is greatly improved and there is no visible or palpable cording today. Pt feels comfortable with discharge to HEP.  She was advised to call with any questions or concerns.    Stability/Clinical Decision Making Stable/Uncomplicated    Rehab Potential Excellent    PT Frequency 2x / week    PT Duration 6 weeks    PT Treatment/Interventions ADLs/Self Care Home Management;Therapeutic exercise;Manual techniques;Patient/family education;Manual lymph drainage;Passive range of motion;Vasopneumatic Device;Taping    PT Next Visit Plan DC to HEP    PT Home Exercise Plan stretches, left breast MLD,theraband    Consulted and Agree with Plan of Care Patient             Patient will benefit from skilled therapeutic intervention in order to improve the following deficits and impairments:  Decreased activity tolerance, Decreased knowledge of precautions, Decreased range of motion, Decreased scar mobility, Increased edema, Increased fascial restricitons, Postural dysfunction, Impaired UE functional use, Pain  Visit Diagnosis: Localized edema  Abnormal posture  Invasive ductal carcinoma of breast, female, left [facility])  Aftercare following surgery for neoplasm  Acute pain of left shoulder     Problem List Patient Active Problem List   Diagnosis Date Noted   Genetic testing 03/04/2020   Tobacco abuse 02/26/2020   Family  history of breast cancer    Family history of rectal cancer    Malignant neoplasm of lower-outer quadrant of  left breast of female, estrogen receptor positive ([place]) 02/13/2020  PHYSICAL THERAPY DISCHARGE SUMMARY  Visits from Start of Care: 18  Current functional level related to goals / functional outcomes: Achieved all goals   Remaining deficits: NA   Education / Equipment: Compression bra,theraband   Patient agrees to discharge. Patient goals were met. Patient is being discharged due to meeting the stated rehab goals.  Claris Pong, PT 12/20/2020, 2:15 PM  [facility] @ [address] [place], [place], [address] Phone: [phone]   Fax:  [phone]  Name: Susan Daniel MRN: [id number] Date of Birth: 1970-09-29

## 2021-01-02 NOTE — Progress Notes (Signed)
[place]  Telephone:([phone]) [phone] Fax:([phone]) [phone]     ID: Susan Daniel DOB: Jul 28, 1970  MR#: [id number]  HTD#:[id number]  Patient Care Team: Wendie Agreste, MD as PCP - General (Family Medicine) Mauro Kaufmann, RN as Oncology Nurse Navigator Rockwell Germany, RN as Oncology Nurse Navigator Everlene Farrier, MD as Consulting Physician (Obstetrics and Gynecology) Donnie Mesa, MD as Consulting Physician (General Surgery) Kyung Rudd, MD as Consulting Physician (Radiation Oncology) Khamauri Bauernfeind, Virgie Dad, MD as Consulting Physician (Oncology) Chauncey Cruel, MD OTHER MD:  I connected with Susan Daniel on 01/03/21 at 12:30 PM EST by telephone visit and verified that I am speaking with the correct person using two identifiers.   I discussed the limitations, risks, security and privacy concerns of performing an evaluation and management service by telemedicine and the availability of in-person appointments. I also discussed with the patient that there may be a patient responsible charge related to this service. The patient expressed understanding and agreed to proceed.   Other persons participating in the visit and their role in the encounter: None  Patients location: Work Providers location: [facility]   I provided 10 minutes of non face-to-face telephone visit time during this encounter, and > 50% was spent counseling as documented under my assessment & plan.   CHIEF COMPLAINT: estrogen receptor positive breast cancer  CURRENT TREATMENT: tamoxifen   INTERVAL HISTORY: Susan Daniel was contacted today for follow up of her estrogen receptor positive breast cancer.  She was out to lunch when I called the first time but we were able to communicate afterwards.  She tells me that she never picked up the tamoxifen until November and then she actually has not started it yet because she and several members of her family were sick with a non-COVID "cold".   She is now feeling better and back to work" started at this point.  She is scheduled for annual mammography on 01/24/2021.   REVIEW OF SYSTEMS: A detailed review of systems today was otherwise stable.   COVID 19 VACCINATION STATUS: Never vaccinated; may have had Covid in January 2020 but not tested   HISTORY OF CURRENT ILLNESS: From the original intake note:  Susan Daniel had routine screening mammography on showing a possible abnormality in the left breast. She underwent left diagnostic mammography with tomography and left breast ultrasonography at [facility] on 02/09/2020 showing: breast density category C; 0.8 cm spiculated mass in left breast at 6 o'clock; 1.4 cm hypoechoic mass in left breast at 5:30, unchanged compared to prior ultrasound in 08/2017; ultrasound of left axilla is negative.  Accordingly on 02/10/2020 she proceeded to biopsy of the left breast area in question. 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 Prognostic indicators significant for: estrogen receptor, 80% positive with moderate-strong staining intensity and progesterone receptor, 100% positive with strong staining intensity. Proliferation marker Ki67 at 10%. HER2 negative by immunohistochemistry (1+).   Cancer Staging  Malignant neoplasm of lower-outer quadrant of left breast of female, estrogen receptor positive ([place]) Staging form: Breast, AJCC 8th Edition - Clinical stage from 02/17/2020: Stage IA (cT1b, cN0, cM0, G2, ER+, PR+, HER2+) - Signed by Chauncey Cruel, MD on 02/26/2020 Stage prefix: Initial diagnosis Method of lymph node assessment: Clinical - Pathologic stage from 03/23/2020: Stage IA (pT1c, pN0, cM0, G1, ER+, PR+, HER2-) - Signed by Gardenia Phlegm, NP on 03/31/2020 Histologic grading system:  3 grade system  The patient's subsequent history is as detailed below.   PAST  MEDICAL HISTORY: Past Medical History:  Diagnosis Date   Allergy    Arthritis    Breast cancer ([place]) 63/87/5643   Left   Complication of anesthesia    constipation   Family history of breast cancer    Family history of rectal cancer     PAST SURGICAL HISTORY: Past Surgical History:  Procedure Laterality Date   BREAST BIOPSY Left 02/10/2020   LEFT   BREAST LUMPECTOMY WITH RADIOACTIVE SEED AND SENTINEL LYMPH NODE BIOPSY Left 03/11/2020   Procedure: LEFT BREAST LUMPECTOMY WITH RADIOACTIVE SEED AND LEFT AXILLARY SENTINEL LYMPH NODE BIOPSY WITH BLUE DYE INJECTION;  Surgeon: Donnie Mesa, MD;  Location: [facility];  Service: General;  Laterality: Left;   DILATION AND CURETTAGE OF UTERUS     RE-EXCISION OF BREAST LUMPECTOMY Left 03/23/2020   Procedure: RE-EXCISION OF INFERIOR MARGIN;  Surgeon: Donnie Mesa, MD;  Location: [facility];  Service: General;  Laterality: Left;   WISDOM TOOTH EXTRACTION      FAMILY HISTORY: Family History  Problem Relation Age of Onset   Heart disease Paternal Grandfather    Aneurysm Paternal Grandfather    Diabetes Maternal Grandmother    Heart disease Maternal Grandmother    Breast cancer Mother 36       second diagnosis at age 44   Kidney disease Maternal Grandfather    Irritable bowel syndrome Other        Cousins   Kidney disease Maternal Uncle    Breast cancer Cousin        dx late 30s/40s; Maternal 1st cousin, daughter of pt's mother's brother   Rectal cancer Maternal Aunt        dx late 30s/40s   Her father died at age 29 shortly after the patient's mother died at age 31 from from what may have been complications of an autologous transplant at [facility] for her breast cancer.. She had a history of breast cancer twice, first at age 35 then again at age 14. Susan Daniel is an only child. For full family information, see genetic counseling note from 02/24/2020.   GYNECOLOGIC HISTORY:  No LMP recorded. (Menstrual status: IUD). Menarche: 45 or 50  years old Age at first live birth: 50 years old, after multiple miscarriages GX P 1 LMP Mirena in place HRT no  Hysterectomy? no BSO? 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  Her husband Susan Daniel is an Chief Financial Officer.  Their daughter Susan Daniel is 35.  The patient in addition has a blue heeler and a [place] bulldog at home.  She is a Psychologist, forensic    ADVANCED DIRECTIVES: In the absence of any documents to the contrary the patient's husband is her healthcare power of attorney   HEALTH MAINTENANCE: Social History   Tobacco Use   Smoking status: Every Day    Packs/day: 0.50    Types: Cigarettes   Smokeless tobacco: Never  Vaping Use   Vaping Use: Some days  Substance Use Topics   Alcohol use: Yes    Comment: ocassionally   Drug use: No     Colonoscopy: n/a  PAP: Up-to-date  Bone density: Never   Allergies  Allergen Reactions   Codeine Nausea And Vomiting   Hydrocodone Nausea And Vomiting   Sulfa Antibiotics Hives    Current Outpatient Medications  Medication Sig Dispense Refill   ibuprofen (ADVIL,MOTRIN) 200 MG tablet Take 400 mg  by mouth every 6 (six) hours as needed for moderate pain.     levonorgestrel (MIRENA) 20 MCG/24HR IUD 1 each by Intrauterine route once.     Multiple Vitamin (MULTIVITAMIN WITH MINERALS) TABS tablet Take 1 tablet by mouth 3 (three) times a week.     traMADol (ULTRAM) 50 MG tablet Take 1 tablet (50 mg total) by mouth every 6 (six) hours as needed for moderate pain or severe pain. (Patient not taking: Reported on 04/28/2020) 12 tablet 0   No current facility-administered medications for this visit.    OBJECTIVE:   There were no vitals filed for this visit.     There is no height or weight on file to calculate BMI.   Wt Readings from Last 3 Encounters:  11/08/20 156 lb (70.8 kg)  10/11/20 151 lb (68.5 kg)  05/18/20 147 lb 6.4 oz (66.9 kg)     ECOG FS:1 - Symptomatic but completely ambulatory  Telemedicine visit  01/03/2021  LAB RESULTS:  CMP     Component Value Date/Time   NA 139 11/08/2020 1039   K 3.9 11/08/2020 1039   CL 105 11/08/2020 1039   CO2 26 11/08/2020 1039   GLUCOSE 98 11/08/2020 1039   BUN 18 11/08/2020 1039   CREATININE 0.71 11/08/2020 1039   CREATININE 0.73 10/25/2012 1220   CALCIUM 8.9 11/08/2020 1039   PROT 6.5 11/08/2020 1039   ALBUMIN 3.9 11/08/2020 1039   AST 20 11/08/2020 1039   ALT 17 11/08/2020 1039   ALKPHOS 56 11/08/2020 1039   BILITOT 0.6 11/08/2020 1039   GFRNONAA >60 11/08/2020 1039   GFRAA  12/05/2007 0529    >60        The eGFR has been calculated using the MDRD equation. This calculation has not been validated in all clinical    No results found for: TOTALPROTELP, ALBUMINELP, A1GS, A2GS, BETS, BETA2SER, GAMS, MSPIKE, SPEI  Lab Results  Component Value Date   WBC 5.3 11/08/2020   NEUTROABS 3.2 11/08/2020   HGB 14.2 11/08/2020   HCT 42.5 11/08/2020   MCV 93.0 11/08/2020   PLT 247 11/08/2020    No results found for: LABCA2  No components found for: [id number]  No results for input(s): INR in the last 168 hours.  No results found for: LABCA2  No results found for: JOI786  No results found for: VEH209  No results found for: OBS962  No results found for: CA2729  No components found for: HGQUANT  No results found for: CEA1 / No results found for: CEA1   No results found for: AFPTUMOR  No results found for: CHROMOGRNA  No results found for: KPAFRELGTCHN, LAMBDASER, KAPLAMBRATIO (kappa/lambda light chains)  No results found for: HGBA, HGBA2QUANT, HGBFQUANT, HGBSQUAN (Hemoglobinopathy evaluation)   Lab Results  Component Value Date   LDH 120 12/05/2007    No results found for: IRON, TIBC, IRONPCTSAT (Iron and TIBC)  No results found for: FERRITIN  Urinalysis No results found for: COLORURINE, APPEARANCEUR, LABSPEC, PHURINE, GLUCOSEU, HGBUR, BILIRUBINUR, KETONESUR, PROTEINUR, UROBILINOGEN, NITRITE,  LEUKOCYTESUR   STUDIES: No results found.   ELIGIBLE FOR AVAILABLE RESEARCH PROTOCOL: No  ASSESSMENT: 50 y.o. Susan Daniel woman status post left breast lower outer quadrant biopsy 02/10/2020 for a clinical T1b N0, stage IA invasive ductal carcinoma, grade 2, estrogen and progesterone receptor positive, HER-2 not amplified, with an MIB-1 of 10%.  (1) genetics testing 03/01/2018 to 03/10/2020 through the [facility] BRCAplus panel and [facility]-Cancer + RNAinsight panel found no deleterious mutations in ATM, BRCA1,  BRCA2, CDH1, CHEK2, PALB2, PTEN, and TP53;  APC, ATM, AXIN2, BARD1, BMPR1A, BRCA1, BRCA2, BRIP1, CDH1, CDK4, CDKN2A, CHEK2, DICER1, EPCAM, GREM1, HOXB13, MEN1, MLH1, MSH2, MSH3, MSH6, MUTYH, NBN, NF1, NF2, NTHL1, PALB2, PMS2, POLD1, POLE, PTEN, RAD51C, RAD51D, RECQL, RET, SDHA, SDHAF2, SDHB, SDHC, SDHD, SMAD4, SMARCA4, STK11, TP53, TSC1, TSC2, and VHL.    (2) left lumpectomy 03/11/2020 showed a pT1c pN0, stage IA  invasive ductal carcinoma, grade 2, with positive margins.  (a) a total of 3 left axillary lymph nodes were removed  (b) additional surgery 03/24/2018 cleared the margins  (3) Oncotype score of 20 predicts a risk of recurrence outside the breast within the next 9 years of 6% if the patient's only systemic therapy is antiestrogens for 5 years.  It also predicts no benefit from chemotherapy.  (4) adjuvant radiation 05/19/2020 through 06/16/2020 Site Technique Total Dose (Gy) Dose per Fx (Gy) Completed Fx Beam Energies  Breast, Left: Breast_Lt 3D 42.56/42.56 2.66 16/16 6X  Breast, Left: Breast_Lt_Bst 3D 8/8 2 4/4 6X   (5) tamoxifen to be started 01/17/2020  (a) Mirena IUD in place   PLAN: [place] will soon be a year out from definitive surgery for her breast cancer with no evidence of disease recurrence.  This is favorable.  We again discussed tamoxifen.  She understands the benefits of this medication.  She does hates to take medication she says.  Even though she is reluctant  she assures me she is going to start it and we picked January 17, 2020 as start date.  I gave her several choices on which of our medical oncologist to follow her and she very much would like to return to Dr. Burr Medico.  Symptoms Deetra saw her surgeon Dr. Georgette Dover about 3 months ago she will see Dr. Morey Hummingbird in 3 months, March 2023, and if they cannot keep the visits 6 months apart like that would be optimal  She knows to call us for any problems that may develop before that visit.  Virgie Dad. Aayana Reinertsen, MD 01/03/2021 1:00 PM Medical Oncology and Hematology [facility] [place], [place] [address] Tel. [phone]    Fax. [phone]   This document serves as a record of services personally performed by Lurline Del, MD. It was created on his behalf by Wilburn Mylar, a trained medical scribe. The creation of this record is based on the scribe's personal observations and the provider's statements to them.   I, Lurline Del MD, have reviewed the above documentation for accuracy and completeness, and I agree with the above.   *Total Encounter Time as defined by the Centers for Medicare and Medicaid Services includes, in addition to the face-to-face time of a patient visit (documented in the note above) non-face-to-face time: obtaining and reviewing outside history, ordering and reviewing medications, tests or procedures, care coordination (communications with other health care professionals or caregivers) and documentation in the medical record.

## 2021-01-03 ENCOUNTER — Telehealth: Payer: No Typology Code available for payment source | Admitting: Oncology

## 2021-01-03 ENCOUNTER — Telehealth (HOSPITAL_BASED_OUTPATIENT_CLINIC_OR_DEPARTMENT_OTHER): Payer: No Typology Code available for payment source | Admitting: Oncology

## 2021-01-03 DIAGNOSIS — C50512 Malignant neoplasm of lower-outer quadrant of left female breast: Secondary | ICD-10-CM | POA: Diagnosis not present

## 2021-01-03 DIAGNOSIS — Z170 Estrogen receptor positive status [ER+]: Secondary | ICD-10-CM

## 2021-01-24 ENCOUNTER — Ambulatory Visit: Payer: No Typology Code available for payment source

## 2021-01-24 ENCOUNTER — Ambulatory Visit
Admission: RE | Admit: 2021-01-24 | Discharge: 2021-01-24 | Disposition: A | Discharge status: Home / Self Care | Payer: No Typology Code available for payment source | Source: Ambulatory Visit | Attending: Oncology | Admitting: Oncology

## 2021-01-24 DIAGNOSIS — C50512 Malignant neoplasm of lower-outer quadrant of left female breast: Secondary | ICD-10-CM

## 2021-02-28 ENCOUNTER — Ambulatory Visit: Payer: No Typology Code available for payment source

## 2021-05-26 ENCOUNTER — Encounter (HOSPITAL_COMMUNITY): Payer: Self-pay

## 2022-08-26 IMAGING — MG MM DIGITAL DIAGNOSTIC UNILAT*L* W/ TOMO W/ CAD
6 series · 6 of 18 positions shown · non-contrast
Comparison: Prior films
COMPARISON: Prior films

Addendum:
CLINICAL DATA: Callback from screening mammogram for possible mass
with distortion left breast

EXAM:
DIGITAL DIAGNOSTIC LEFT MAMMOGRAM WITH CAD AND TOMOSYNTHESIS
ULTRASOUND left BREAST
TECHNIQUE: Left digital diagnostic mammography and breast tomosynthesis was
performed. Digital images of the left breast were evaluated with
computer-aided detection. Targeted ultrasound examination of the
left breast was performed.

[L CC synth-2D]
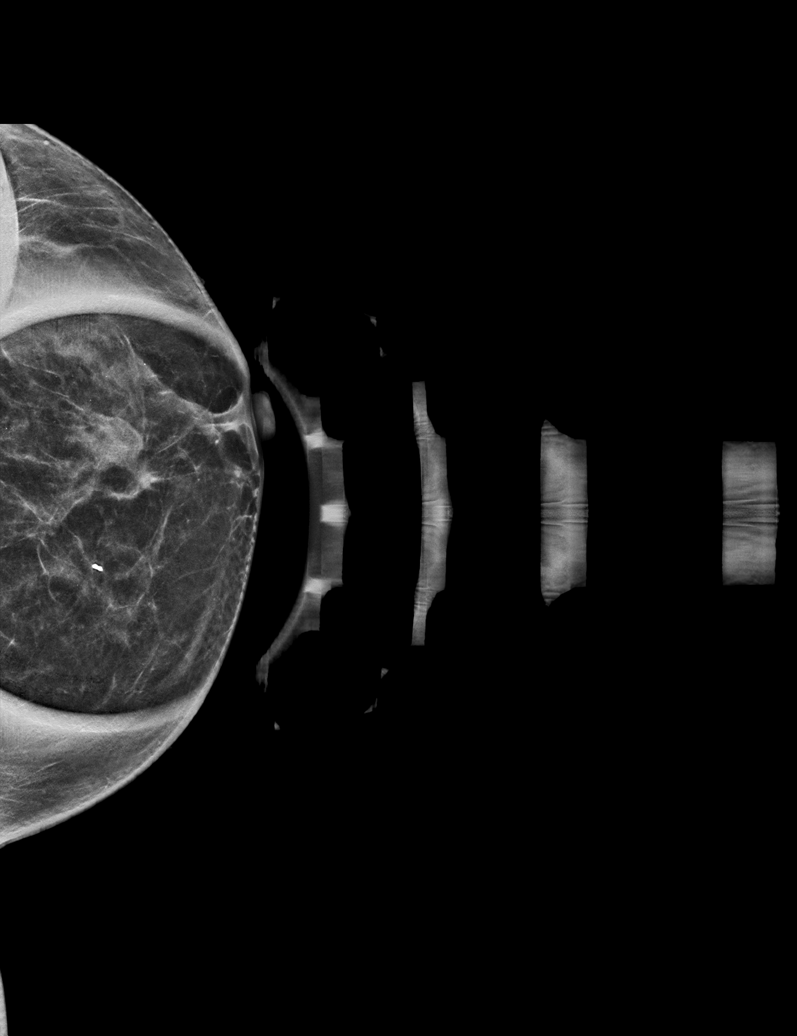

[L ML synth-2D]
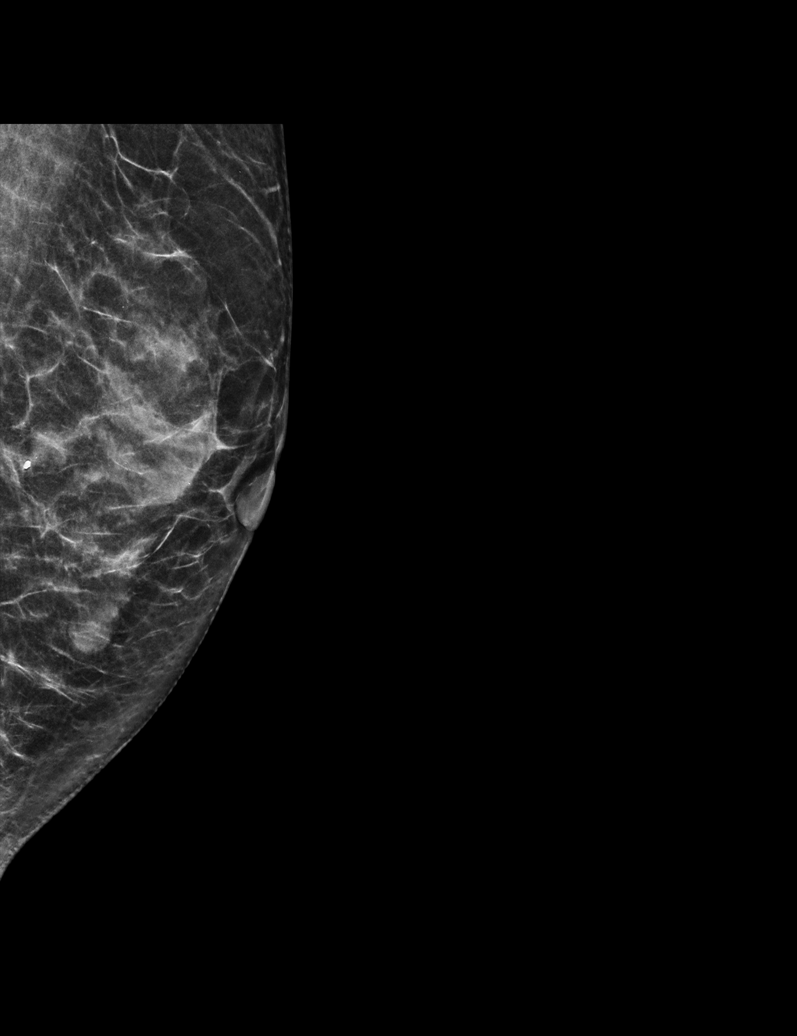

[L MLO synth-2D]
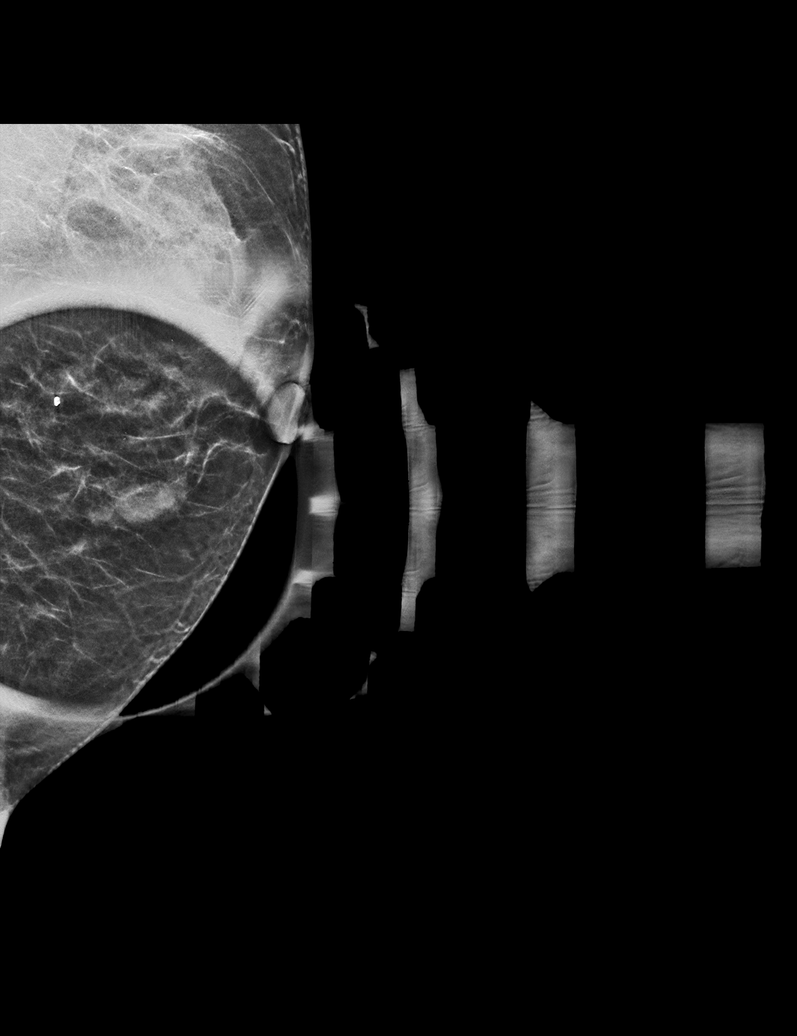

[L MLO tomo · tomo slice 23/44.0]
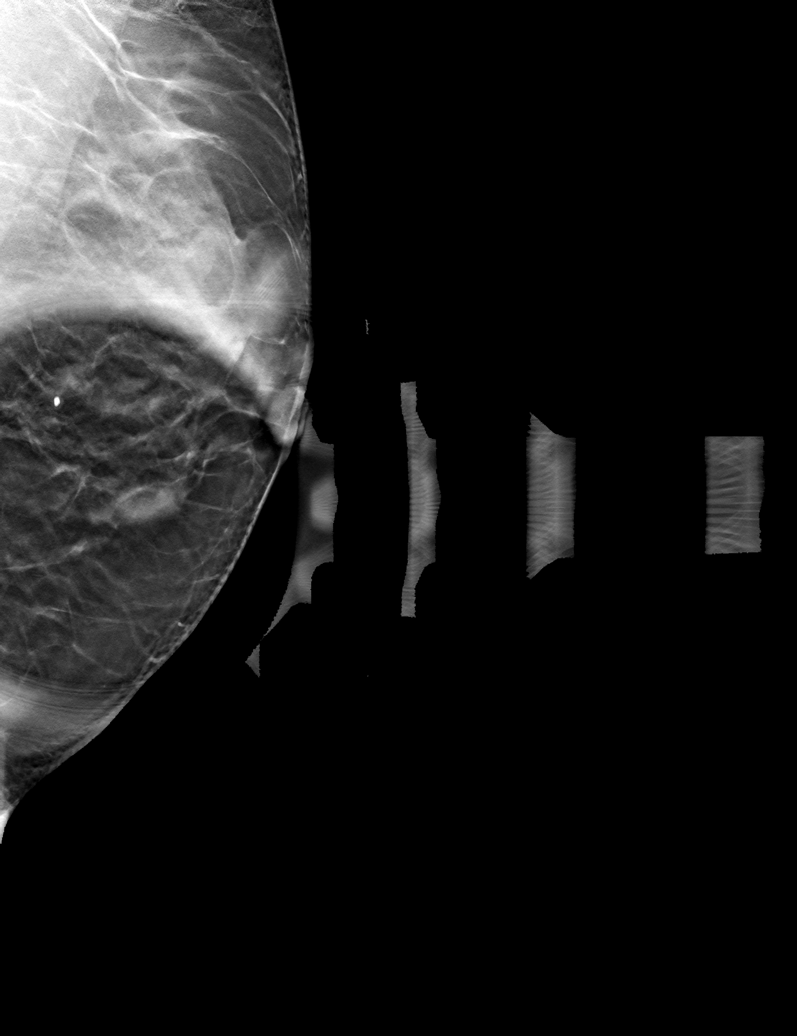

[L CC tomo · tomo slice 27/52.0]
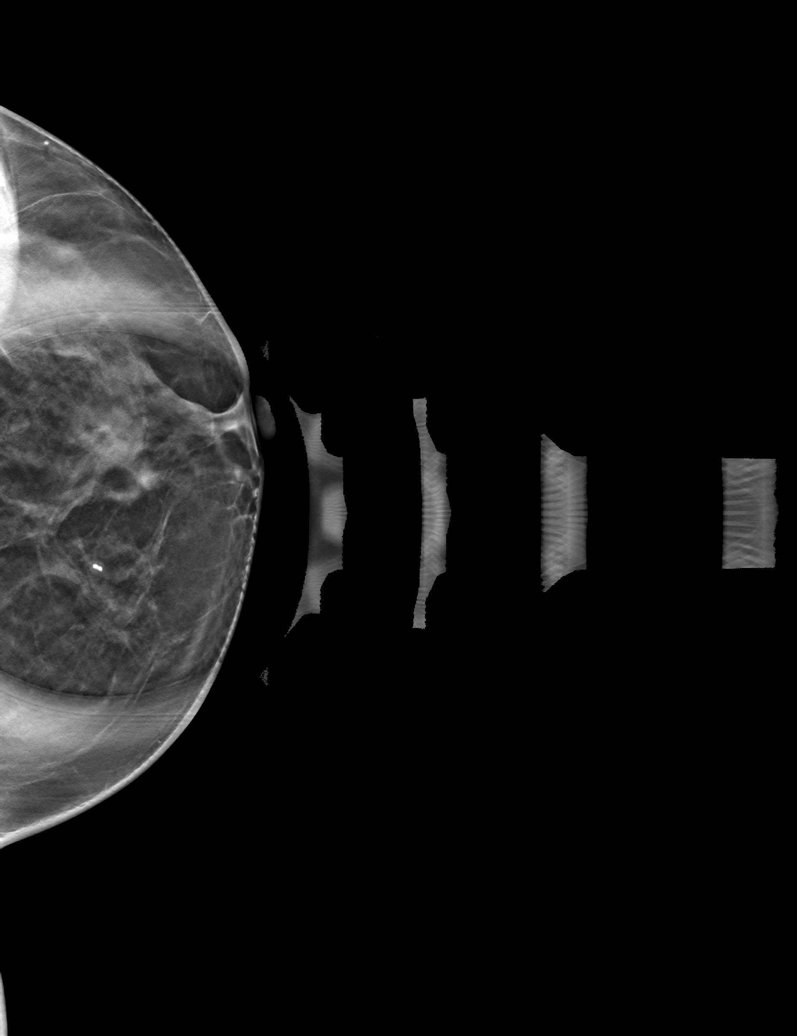

[L ML tomo · tomo slice 27/52.0]
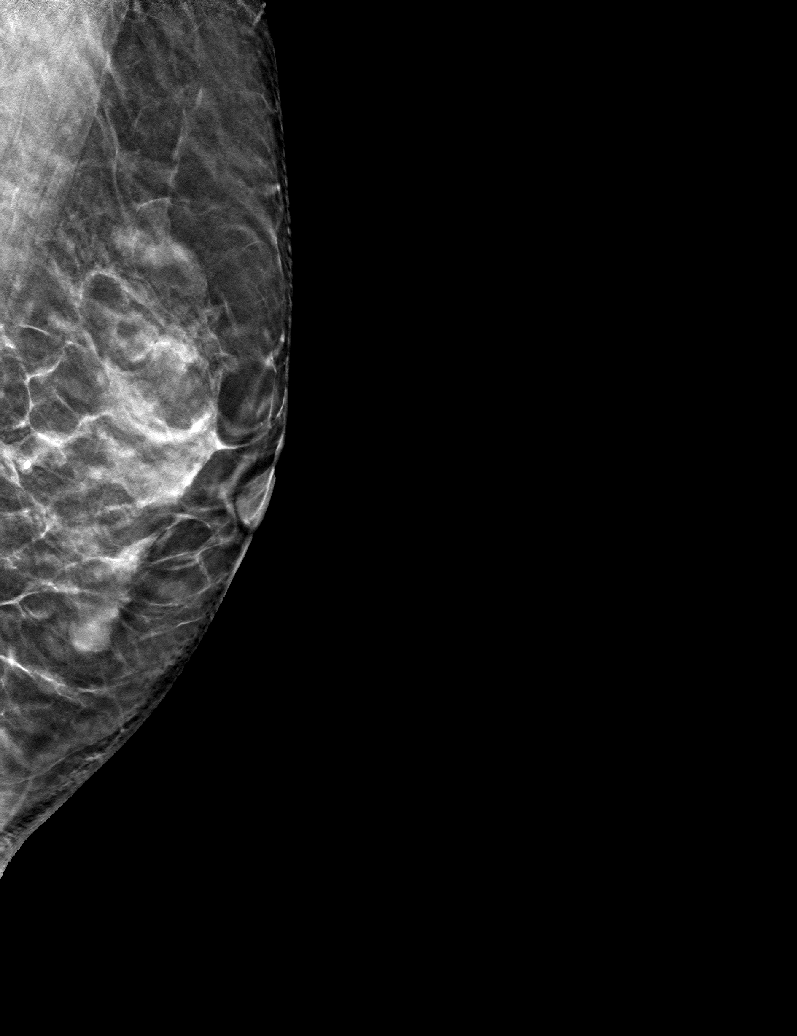

[6 of 18 positions shown; findings below may reference images not displayed]

ACR Breast Density Category c: The breast tissue is heterogeneously
dense, which may obscure small masses.
FINDINGS: Lateral view of left breast, spot compression cc and lateral views
of the left breast are submitted. Previously questioned mass with
spiculation persists on additional views in the lower left breast.
An adjacent oval mass is identified.

Targeted ultrasound is performed, showing 0.8 x 0 5 x 0.8 cm
irregular spiculated mass at the left breast 6 o'clock 2 cm from
nipple correlating to the callback mass with spiculation. At the
left breast 5:30 o'clock 3 cm from nipple, there is hypoechoic oval
mass measuring 1.4 x 0.8 x 0.6 cm. This is unchanged compared to
prior ultrasound August 2017.
IMPRESSION: Highly suspicious findings.

RECOMMENDATION:
Ultrasound-guided core biopsy of spiculated mass at left breast 6
o'clock position. Consider biopsy of the left breast 5:30 o'clock
lesion to establish tissue diagnosis prior to surgical excision for
the left breast 6 o'clock lesion

I have discussed the findings and recommendations with the patient.
If applicable, a reminder letter will be sent to the patient
regarding the next appointment.

BI-RADS CATEGORY  5: Highly suggestive of malignancy.

ADDENDUM:
Ultrasound of the left axilla is negative.

*** End of Addendum ***
ACR Breast Density Category c: The breast tissue is heterogeneously
dense, which may obscure small masses.
FINDINGS: Lateral view of left breast, spot compression cc and lateral views
of the left breast are submitted. Previously questioned mass with
spiculation persists on additional views in the lower left breast.
An adjacent oval mass is identified.

Targeted ultrasound is performed, showing 0.8 x 0 5 x 0.8 cm
irregular spiculated mass at the left breast 6 o'clock 2 cm from
nipple correlating to the callback mass with spiculation. At the
left breast 5:30 o'clock 3 cm from nipple, there is hypoechoic oval
mass measuring 1.4 x 0.8 x 0.6 cm. This is unchanged compared to
prior ultrasound August 2017.
IMPRESSION: Highly suspicious findings.

RECOMMENDATION:
Ultrasound-guided core biopsy of spiculated mass at left breast 6
o'clock position. Consider biopsy of the left breast 5:30 o'clock
lesion to establish tissue diagnosis prior to surgical excision for
the left breast 6 o'clock lesion

I have discussed the findings and recommendations with the patient.
If applicable, a reminder letter will be sent to the patient
regarding the next appointment.

BI-RADS CATEGORY  5: Highly suggestive of malignancy.

## 2022-08-27 IMAGING — MG MM BREAST LOCALIZATION CLIP
4 series · 4 of 12 positions shown · non-contrast
Comparison: Previous exam(s).

CLINICAL DATA: Evaluate RIBBON biopsy clip placement following
ultrasound-guided LEFT breast biopsy.

EXAM:
DIAGNOSTIC LEFT MAMMOGRAM POST ULTRASOUND BIOPSY

[L ML synth-2D]
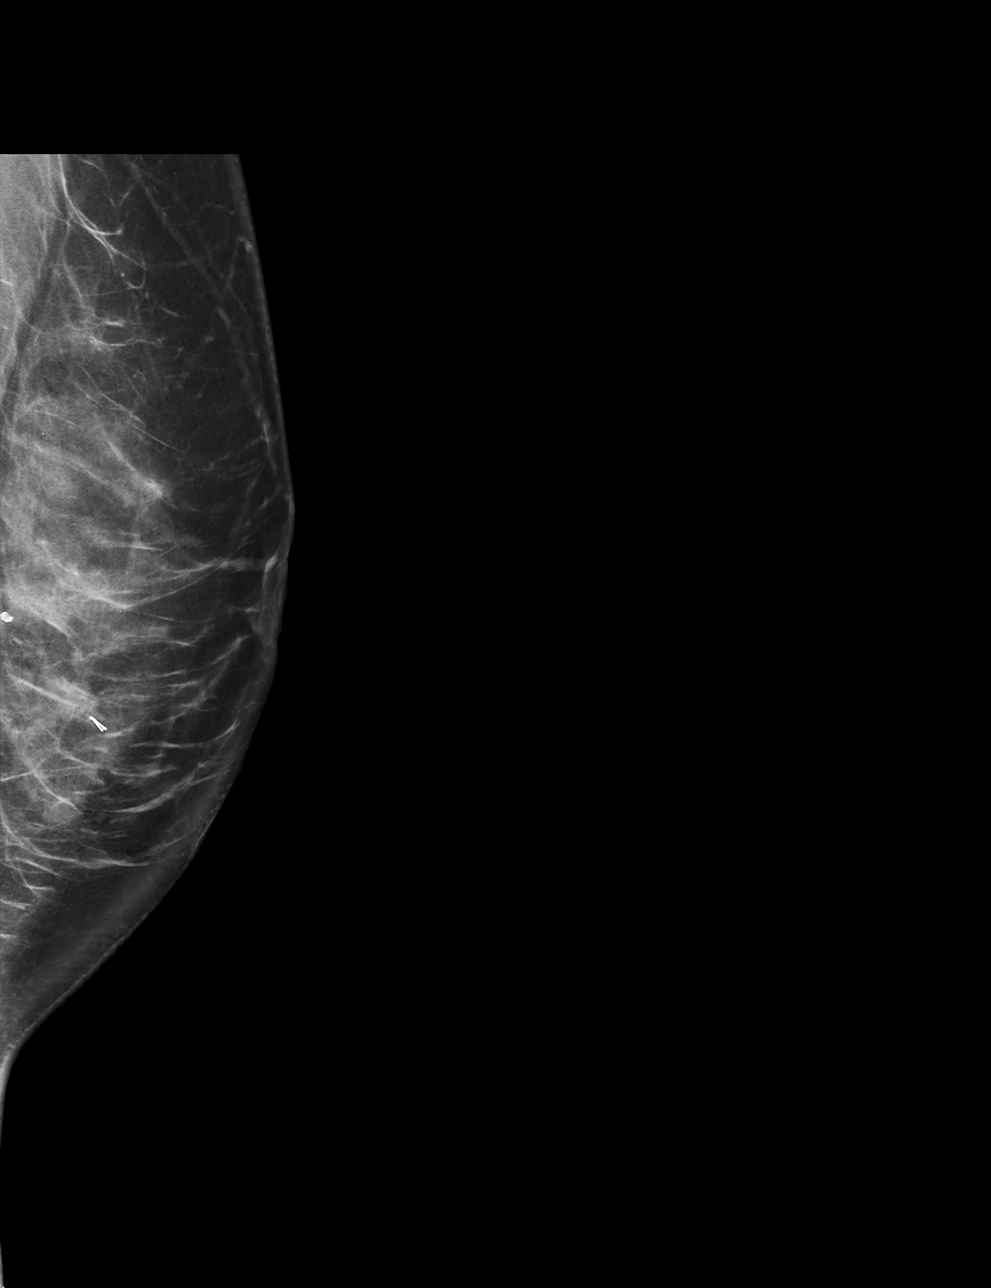

[L CC synth-2D]
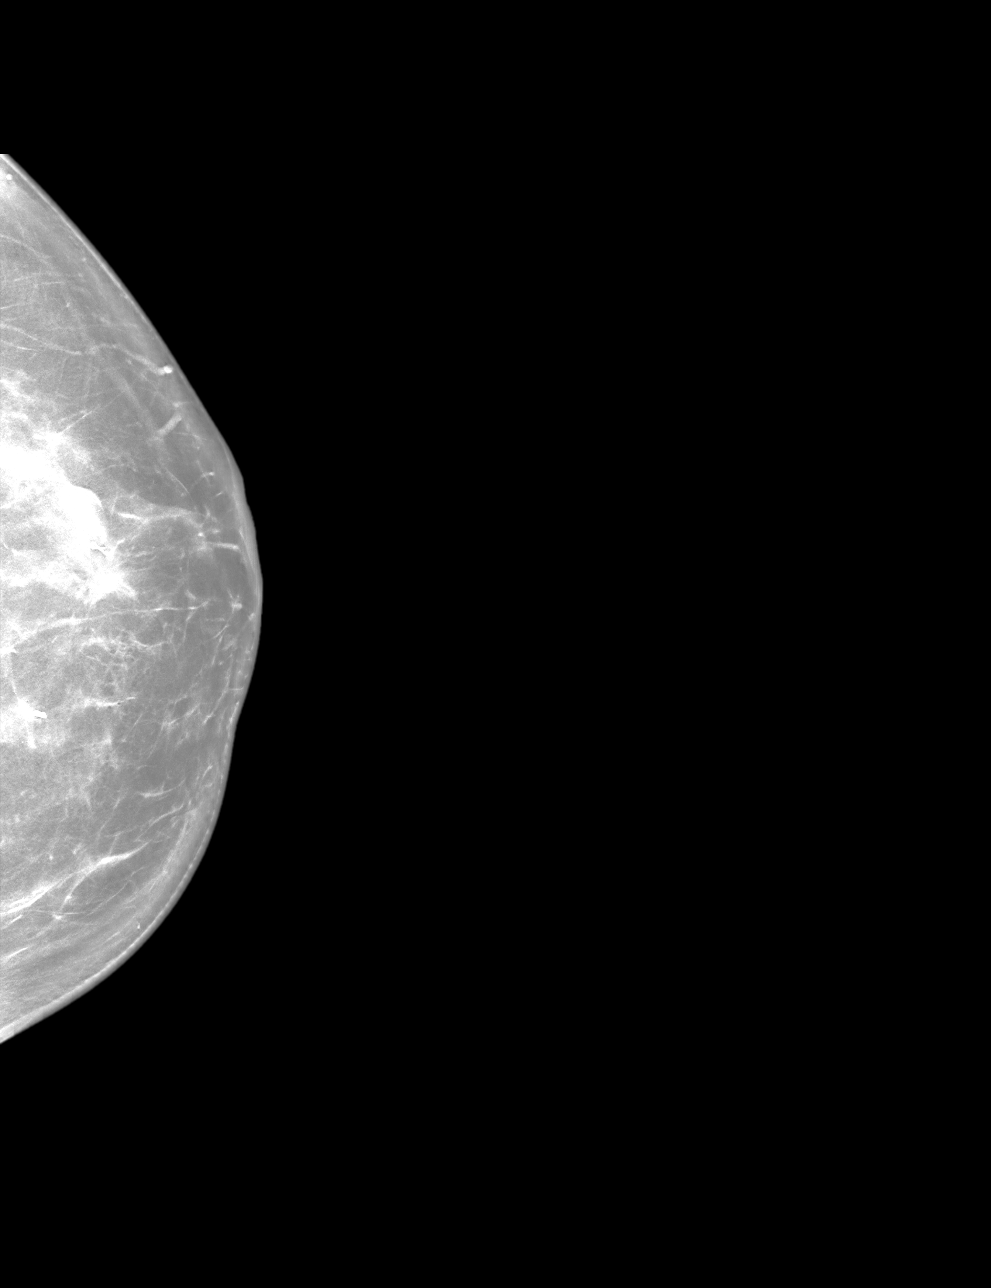

[L CC tomo · tomo slice 38/75.0]
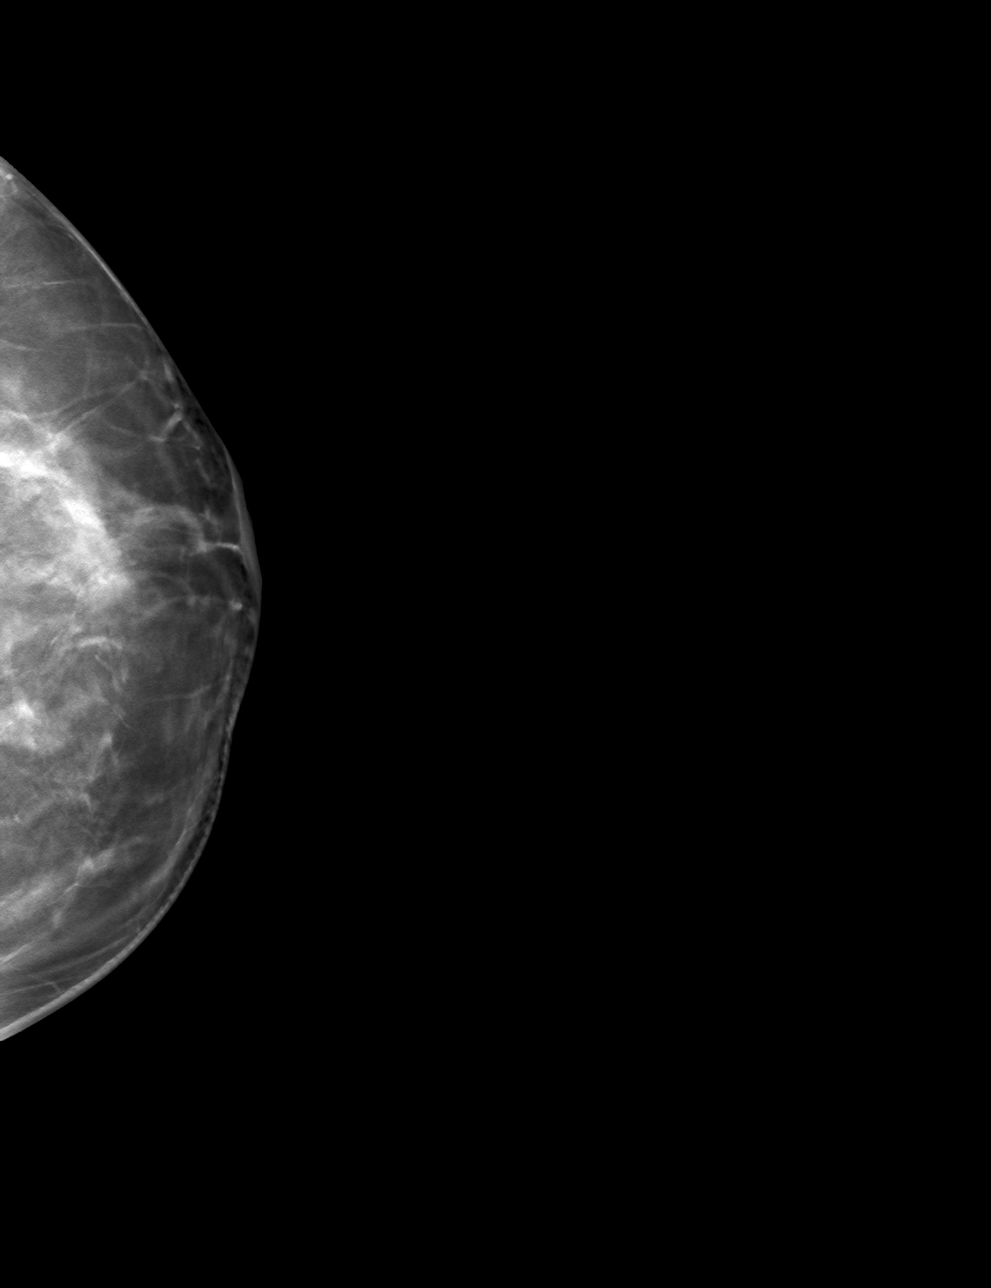

[L ML tomo · tomo slice 41/82.0]
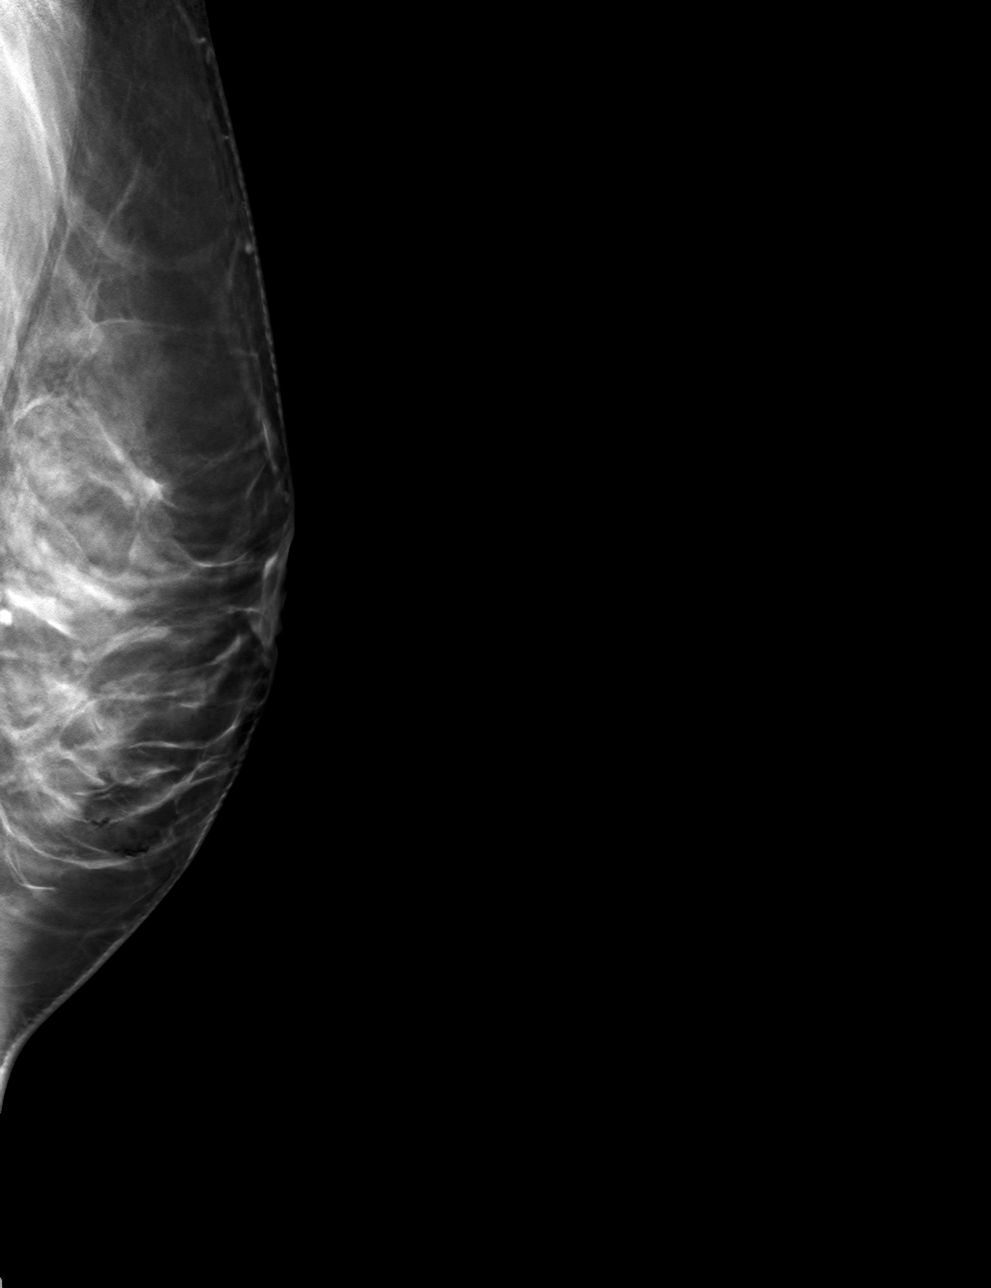

[4 of 12 positions shown; findings below may reference images not displayed]

FINDINGS: Mammographic images were obtained following ultrasound guided biopsy
of the 0.8 cm mass at the 6 o'clock position of the LEFT breast. The
RIBBON biopsy marking clip is in expected position at the site of
biopsy.
IMPRESSION: Appropriate positioning of the RIBBON shaped biopsy marking clip at
the site of biopsy in the LOWER LEFT breast.

Final Assessment: Post Procedure Mammograms for Marker Placement
